# Patient Record
Sex: Female | Born: 1975 | Race: White | Hispanic: No | Marital: Married | Smoking: Current every day smoker
Health system: Southern US, Community
[De-identification: ages and names within clinical notes are randomized; demographics above are authoritative.]

## PROBLEM LIST (undated history)

## (undated) DIAGNOSIS — Z98811 Dental restoration status: Secondary | ICD-10-CM

## (undated) DIAGNOSIS — Z8719 Personal history of other diseases of the digestive system: Secondary | ICD-10-CM

## (undated) DIAGNOSIS — N62 Hypertrophy of breast: Secondary | ICD-10-CM

## (undated) DIAGNOSIS — K219 Gastro-esophageal reflux disease without esophagitis: Secondary | ICD-10-CM

## (undated) DIAGNOSIS — Z8711 Personal history of peptic ulcer disease: Secondary | ICD-10-CM

## (undated) DIAGNOSIS — I10 Essential (primary) hypertension: Secondary | ICD-10-CM

## (undated) HISTORY — PX: REDUCTION MAMMAPLASTY: SUR839

## (undated) HISTORY — PX: UPPER GASTROINTESTINAL ENDOSCOPY: SHX188

---
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
## 2011-01-28 ENCOUNTER — Emergency Department (HOSPITAL_COMMUNITY)
Admission: EM | Admit: 2011-01-28 | Discharge: 2011-01-28 | Disposition: A | Discharge status: Home / Self Care | Payer: BC Managed Care – PPO | Attending: Emergency Medicine | Admitting: Emergency Medicine

## 2011-01-28 DIAGNOSIS — K2970 Gastritis, unspecified, without bleeding: Secondary | ICD-10-CM | POA: Insufficient documentation

## 2011-01-28 DIAGNOSIS — R1013 Epigastric pain: Secondary | ICD-10-CM | POA: Insufficient documentation

## 2011-01-28 LAB — URINALYSIS, ROUTINE W REFLEX MICROSCOPIC
Hgb urine dipstick: NEGATIVE
Specific Gravity, Urine: >1.03 — ABNORMAL HIGH (ref 1.005–1.030)
Urobilinogen, UA: 0.2 mg/dL (ref 0.0–1.0)

## 2011-01-28 LAB — CBC
Hemoglobin: 13.9 g/dL (ref 12.0–15.0)
MCH: 31.1 pg (ref 26.0–34.0)
RBC: 4.47 MIL/uL (ref 3.87–5.11)
WBC: 9.7 10*3/uL (ref 4.0–10.5)

## 2011-01-28 LAB — COMPREHENSIVE METABOLIC PANEL
ALT: 15 U/L (ref 0–35)
AST: 22 U/L (ref 0–37)
Albumin: 3.5 g/dL (ref 3.5–5.2)
Alkaline Phosphatase: 70 U/L (ref 39–117)
CO2: 24 mEq/L (ref 19–32)
Chloride: 106 mEq/L (ref 96–112)
GFR calc Af Amer: >60 mL/min (ref >60)
GFR calc non Af Amer: >60 mL/min (ref >60)
Potassium: 4 mEq/L (ref 3.5–5.1)
Sodium: 137 mEq/L (ref 135–145)
Total Bilirubin: 0.4 mg/dL (ref 0.3–1.2)

## 2011-01-28 LAB — DIFFERENTIAL
Basophils Absolute: 0 10*3/uL (ref 0.0–0.1)
Basophils Relative: 0 % (ref 0–1)
Monocytes Relative: 7 % (ref 3–12)
Neutro Abs: 5.2 10*3/uL (ref 1.7–7.7)
Neutrophils Relative %: 54 % (ref 43–77)

## 2011-09-05 LAB — OB RESULTS CONSOLE ABO/RH: RH Type: NEGATIVE

## 2011-09-05 LAB — OB RESULTS CONSOLE GC/CHLAMYDIA
Chlamydia: NEGATIVE
Gonorrhea: NEGATIVE

## 2011-09-05 LAB — OB RESULTS CONSOLE RPR: RPR: NONREACTIVE

## 2011-09-05 LAB — OB RESULTS CONSOLE ANTIBODY SCREEN: Antibody Screen: NEGATIVE

## 2011-09-05 LAB — OB RESULTS CONSOLE HIV ANTIBODY (ROUTINE TESTING): HIV: NONREACTIVE

## 2011-09-29 ENCOUNTER — Ambulatory Visit (HOSPITAL_COMMUNITY)
Admission: RE | Admit: 2011-09-29 | Discharge: 2011-09-29 | Disposition: A | Discharge status: Home / Self Care | Payer: BC Managed Care – PPO | Source: Ambulatory Visit | Attending: Obstetrics and Gynecology | Admitting: Obstetrics and Gynecology

## 2011-09-29 ENCOUNTER — Encounter (HOSPITAL_COMMUNITY): Payer: Self-pay

## 2011-09-29 ENCOUNTER — Other Ambulatory Visit (HOSPITAL_COMMUNITY): Payer: Self-pay | Admitting: Obstetrics and Gynecology

## 2011-09-29 DIAGNOSIS — R291 Meningismus: Secondary | ICD-10-CM

## 2011-09-29 DIAGNOSIS — IMO0002 Reserved for concepts with insufficient information to code with codable children: Secondary | ICD-10-CM

## 2011-09-29 DIAGNOSIS — O09529 Supervision of elderly multigravida, unspecified trimester: Secondary | ICD-10-CM | POA: Insufficient documentation

## 2011-09-29 DIAGNOSIS — O351XX0 Maternal care for (suspected) chromosomal abnormality in fetus, not applicable or unspecified: Secondary | ICD-10-CM | POA: Insufficient documentation

## 2011-09-29 DIAGNOSIS — O3510X0 Maternal care for (suspected) chromosomal abnormality in fetus, unspecified, not applicable or unspecified: Secondary | ICD-10-CM | POA: Insufficient documentation

## 2011-09-29 NOTE — Progress Notes (Signed)
Obstetric ultrasound performed today.  Please see report in [facility]. 

## 2011-09-29 NOTE — Progress Notes (Signed)
Obstetric ultrasound completed today.  Please see report in [facility]. 

## 2011-09-30 ENCOUNTER — Other Ambulatory Visit: Payer: Self-pay

## 2011-09-30 DIAGNOSIS — O09519 Supervision of elderly primigravida, unspecified trimester: Secondary | ICD-10-CM | POA: Insufficient documentation

## 2011-09-30 DIAGNOSIS — O289 Unspecified abnormal findings on antenatal screening of mother: Secondary | ICD-10-CM | POA: Insufficient documentation

## 2011-09-30 NOTE — Progress Notes (Signed)
Genetic Counseling  High-Risk Gestation Note  Appointment Date:  09/29/2011 Referred By: Meriel Pica, MD Date of Birth:  Sep 14, 1976 Partner: Margarita Rana  Pregnancy History: G1P0000 Estimated Date of Delivery: 04/08/12 Estimated Gestational Age: [redacted]w[redacted]d Attending: Rica Koyanagi, MD    Carol Dixon and her husband, Mr. Aitanna Haubner, were seen for genetic counseling regarding a maternal age of 35 y.o. at delivery and an increased risk for Down syndrome based on first trimester screening performed through [facility]. The couple was also accompanied by the patient's mother.   They were counseled regarding maternal age and the association with risk for chromosome conditions due to nondisjunction with aging of the ova.   We reviewed chromosomes, nondisjunction, and the associated 1 in 114 risk for fetal aneuploidy related to a maternal age of 21 at delivery at [redacted]w[redacted]d gestation.  They were counseled that the risk for aneuploidy decreases as gestational age increases, accounting for those pregnancies which spontaneously abort.  We specifically discussed Down syndrome (trisomy 47), trisomies 88 and 34, and sex chromosome aneuploidies (47,XXX and 47,XXY) including the common features and prognoses of each.   We also reviewed Mrs. Osei's First trimester screen result and the associated increase in risk for fetal Down syndrome (1 in 279 to 1 in 75).  They were counseled regarding other explanations for a screen positive result including normal variation and differences in maternal metabolism.  In addition, we reviewed the screen adjusted reduction in risks for trisomy 18/13 (1 in 468 to 1 in 2,402).  They understand that First trimester screening provides a pregnancy specific risk for Down syndrome, but is not considered to be diagnostic.  We discussed that the nuchal translucency measurement obtained for first trimester screening was reported to be 2.29mm, which is at the 95%tile. Ultrasound performed  at the time of today's visit confirmed this finding. Complete ultrasound results reported separately.   We reviewed the various common etiologies for an increased NT including: aneuploidy, single gene conditions, and cardiac or great vessel abnormalities. Additionally, increased NT can be due to lymphatic system failure, decreased fetal movement, and fetal anemia.   We also discussed single gene conditions and that these conditions are not routinely tested for prenatally unless ultrasound findings or family history significantly increase the suspicion of a specific single gene disorder.  We also discussed that an increased NT can be a normal variant.   They were counseled regarding other available screening and diagnostic options including ultrasound, CVS, and amniocentesis.  Fetal echocardiogram is also available to the patient given the finding of increased NT. The risks, benefits, and limitations of each of these options were reviewed in detail. We discussed another type of screening test, noninvasive prenatal testing (NIPT), which utilizes cell free fetal DNA found in the maternal circulation. This test is not diagnostic for chromosome conditions, but can provide information regarding the presence or absence of extra fetal DNA for chromosomes 13, 18 and 21. Thus, it would not identify or rule out sex chromosome conditions. The reported detection rate is greater than 99% for Trisomy 21, greater than 97% for Trisomy 18, and is approximately 80% (8 out of 10) for Trisomy 13. The false positive rate is thought to be less than 1% for any of these conditions. After consideration of all the options, and a clear understanding of the newness and limitations of NIPT, they elected to proceed with cell free fetal DNA testing today but declined amniocentesis and CVS. Those results will be available in 8-10  days and will be forwarded to her OB office when we receive them. Detailed ultrasound and fetal echocardiogram are  available to the patient in the second trimester.  They understand that screening tests cannot rule out all birth defects or genetic syndromes.  The patient was advised of this limitation and states she still does not want diagnostic testing at this time.  However, they were counseled that 50-80% of fetuses with Down syndrome and up to 90% of fetuses with trisomies 13 and 18, when well visualized, have detectable anomalies or soft markers by ultrasound.    Mrs. Soward was provided with written information regarding cystic fibrosis (CF) including the carrier frequency and incidence in the Caucasian population, the availability of carrier testing and prenatal diagnosis if indicated.  In addition, we discussed that CF is routinely screened for as part of the [place] newborn screening panel.  She declined testing today.   Both family histories were reviewed and found to be noncontributory for birth defects, mental retardation, and known genetic conditions. Without further information regarding the provided family history, an accurate genetic risk cannot be calculated. Further genetic counseling is warranted if more information is obtained.  Mrs. Lund denied exposure to environmental toxins or chemical agents. She denied the use of alcohol or street drugs. She reported smoking approximately 5 cigarettes per day. The associations of smoking in pregnancy were reviewed and cessation encouraged. She denied significant viral illnesses during the course of her pregnancy. Her medical and surgical histories were noncontributory.     I counseled this couple regarding the above risks and available options.  The approximate face-to-face time with the genetic counselor was 30 minutes.    Quinn Plowman, MS,  Certified Genetic Counselor 09/30/2011

## 2011-10-07 ENCOUNTER — Telehealth (HOSPITAL_COMMUNITY): Payer: Self-pay | Admitting: MS"

## 2011-10-07 NOTE — Telephone Encounter (Signed)
Called Mrs. Carol Dixon to discuss results of [facility] testing (NIPT), cell free fetal DNA testing. These results are low risk. The chances for trisomy 45, trisomy 51, and trisomy 66 are reduced to less than 1 in 10,000 (0.01%). Reviewed limitations of this testing. Patient happy to hear results.

## 2011-11-07 ENCOUNTER — Other Ambulatory Visit: Payer: Self-pay

## 2012-02-08 ENCOUNTER — Observation Stay (HOSPITAL_COMMUNITY)
Admission: AD | Admit: 2012-02-08 | Discharge: 2012-02-10 | Disposition: A | Discharge status: Home / Self Care | Payer: BC Managed Care – PPO | Source: Ambulatory Visit | Attending: Obstetrics and Gynecology | Admitting: Obstetrics and Gynecology

## 2012-02-08 ENCOUNTER — Encounter (HOSPITAL_COMMUNITY): Payer: Self-pay | Admitting: *Deleted

## 2012-02-08 DIAGNOSIS — IMO0002 Reserved for concepts with insufficient information to code with codable children: Principal | ICD-10-CM | POA: Insufficient documentation

## 2012-02-08 DIAGNOSIS — O09519 Supervision of elderly primigravida, unspecified trimester: Secondary | ICD-10-CM

## 2012-02-08 DIAGNOSIS — O289 Unspecified abnormal findings on antenatal screening of mother: Secondary | ICD-10-CM

## 2012-02-08 DIAGNOSIS — O1490 Unspecified pre-eclampsia, unspecified trimester: Secondary | ICD-10-CM

## 2012-02-08 MED ORDER — DOCUSATE SODIUM 100 MG PO CAPS
100.0000 mg | ORAL_CAPSULE | Freq: Every day | ORAL | Status: DC
  Administered 2012-02-09 – 2012-02-10 (×2): 100 mg via ORAL
  Filled 2012-02-08 (×3): qty 1

## 2012-02-08 MED ORDER — CALCIUM CARBONATE ANTACID 500 MG PO CHEW: 2.0000 | CHEWABLE_TABLET | ORAL | Status: DC | PRN

## 2012-02-08 MED ORDER — PRENATAL MULTIVITAMIN CH
1.0000 | ORAL_TABLET | Freq: Every day | ORAL | Status: DC
Start: 2012-02-09 — End: 2012-02-10
  Administered 2012-02-09 – 2012-02-10 (×2): 1 via ORAL
  Filled 2012-02-08 (×3): qty 1

## 2012-02-08 MED ORDER — ACETAMINOPHEN 325 MG PO TABS: 650.0000 mg | ORAL_TABLET | ORAL | Status: DC | PRN

## 2012-02-08 MED ORDER — BETAMETHASONE SOD PHOS & ACET 6 (3-3) MG/ML IJ SUSP
12.0000 mg | INTRAMUSCULAR | Status: AC
  Administered 2012-02-08 – 2012-02-09 (×2): 12 mg via INTRAMUSCULAR
  Filled 2012-02-08 (×2): qty 2

## 2012-02-08 MED ORDER — ZOLPIDEM TARTRATE 10 MG PO TABS: 10.0000 mg | ORAL_TABLET | Freq: Every evening | ORAL | Status: DC | PRN

## 2012-02-08 NOTE — H&P (Signed)
  Carol Dixon  DICTATION # [id number] CSN# [id number]   Meriel Pica, MD 02/08/2012 9:17 PM

## 2012-02-09 ENCOUNTER — Encounter (HOSPITAL_COMMUNITY): Payer: Self-pay | Admitting: *Deleted

## 2012-02-09 LAB — CREATININE CLEARANCE, URINE, 24 HOUR
Creatinine Clearance: 135 mL/min — ABNORMAL HIGH (ref 75–115)
Creatinine, 24H Ur: 1144 mg/d (ref 700–1800)

## 2012-02-09 LAB — COMPREHENSIVE METABOLIC PANEL
ALT: 10 U/L (ref 0–35)
Alkaline Phosphatase: 169 U/L — ABNORMAL HIGH (ref 39–117)
CO2: 21 mEq/L (ref 19–32)
Chloride: 104 mEq/L (ref 96–112)
GFR calc Af Amer: >90 mL/min (ref >90)
GFR calc non Af Amer: >90 mL/min (ref >90)
Glucose, Bld: 157 mg/dL — ABNORMAL HIGH (ref 70–99)
Potassium: 3.8 mEq/L (ref 3.5–5.1)
Sodium: 138 mEq/L (ref 135–145)
Total Bilirubin: 0.1 mg/dL — ABNORMAL LOW (ref 0.3–1.2)
Total Protein: 5.2 g/dL — ABNORMAL LOW (ref 6.0–8.3)

## 2012-02-09 LAB — CBC
HCT: 31 % — ABNORMAL LOW (ref 36.0–46.0)
Hemoglobin: 10.5 g/dL — ABNORMAL LOW (ref 12.0–15.0)
RBC: 3.39 MIL/uL — ABNORMAL LOW (ref 3.87–5.11)
WBC: 15.1 10*3/uL — ABNORMAL HIGH (ref 4.0–10.5)

## 2012-02-09 MED ORDER — FERROUS SULFATE 325 (65 FE) MG PO TABS
325.0000 mg | ORAL_TABLET | Freq: Every day | ORAL | Status: DC
  Administered 2012-02-09 – 2012-02-10 (×2): 325 mg via ORAL
  Filled 2012-02-09 (×2): qty 1

## 2012-02-09 NOTE — Progress Notes (Signed)
31 4/7 weeks  Mild HA for 2 weeks, none today, no vision change/epigastric pain  Blood pressure 130/72, pulse 85, temperature 98.2 F (36.8 C), temperature source Oral, resp. rate 16, height 4\' 10"  (1.473 m), weight 83.326 kg (183 lb 11.2 oz), last menstrual period 06/28/2011.  Lungs CTA Abd No epigastric pain DTR  2+ without clonus  NST reactive  A: R/O preeclampsia-BPs in office rising over last 2 weeks. On Monday had elevated BP with some proteinuria.  Wed BP 160s/90s with 3+ ptn on dipstick.  P:  Preeclampsia       Continue 24 hour urine       Finish BMTZ course       D/W patient BR as pregnancy progresses

## 2012-02-09 NOTE — Progress Notes (Signed)
UR Chart review completed.  

## 2012-02-09 NOTE — Progress Notes (Signed)
Lactation Consultation Note  Patient Name: Carol Dixon Date: 02/09/2012   Patient is [redacted] weeks gestation and currently on antenatal unit for 24-hour urine test.  She has been watching in-house baby care and breastfeeding videos and has questions about choosing a breast pump.  LC provided the [facility] Resource packet with contact information and pump information but recommends mom wait until baby is born to decide on pump choice, based on special needs if baby born premature and also insurance coverage for purchased or rental pump.  LC encouraged mom to contact [facility] Resources as needed, both during and/or after hospital stay.  Maternal Data    Feeding    LATCH Score/Interventions      N/A - mom is antepartum                Lactation Tools Discussed/Used    Pump options and resources Consult Status    Mom to contact [facility] for further questions, as needed  Lynda Rainwater 02/09/2012, 7:09 PM

## 2012-02-09 NOTE — Progress Notes (Signed)
First assist with stedy.

## 2012-02-09 NOTE — Progress Notes (Signed)
Third assist to bathroom via stedy.

## 2012-02-09 NOTE — Progress Notes (Signed)
Second assist to bathroom via [employer]

## 2012-02-09 NOTE — H&P (Signed)
Carol Dixon, Carol Dixon                 ACCOUNT NO.:  [id number]  MEDICAL RECORD NO.:  [id number]  LOCATION:                                 FACILITY:  PHYSICIAN:  Duke Salvia. Marcelle Overlie, M.D.DATE OF BIRTH:  03/23/1976  DATE OF ADMISSION:  02/08/2012 DATE OF DISCHARGE:                             HISTORY & PHYSICAL   CHIEF COMPLAINT:  Preeclampsia.  HISTORY OF PRESENT ILLNESS:  A 36 year old, G1, P0, EDD April 08, 2012, EGA 31-3/7 weeks.  This patient was seen 2 days ago with 3+ protein, BP 140/90.  PIH labs were normal.  Ultrasound showed normal findings with BPP 8/8.  Today, she was here for followup, still 3+ protein, BP 160/90, complaining of some headache and seeing some occasional stars that clear with blinking.  Admitted now for 24-hour urine, betamethasone series, and further observation.  PAST MEDICAL HISTORY:  Please see their [place] form for details.  She is Rh negative.  PHYSICAL EXAMINATION:  VITAL SIGNS:  Temp 98.2, blood pressure 160/90. HEENT:  Unremarkable. NECK:  Supple without masses. LUNGS:  Clear. CARDIOVASCULAR:  Regular rate and rhythm without murmurs, rubs, or gallops. BREASTS:  Not examined. PELVIC:  32 cm fundal height.  Cervix was long, closed. EXTREMITIES:  2+ lower extremity edema.  Reflexes 1 to 2+.  No clonus.  IMPRESSION: 1. 37-1/7th weeks intrauterine pregnancy. 2. Preeclampsia.  PLAN:  We will admit for betamethasone, 24-hour urine, and further observation.     Katelyn Kohlmeyer M. Marcelle Overlie, M.D.     RM[facility]/MEDQ  D:  02/08/2012  T:  02/08/2012  Job:  [id number]

## 2012-02-10 LAB — PROTEIN, URINE, 24 HOUR: Protein, Urine: 299 mg/dL

## 2012-02-10 LAB — COMPREHENSIVE METABOLIC PANEL
ALT: 10 U/L (ref 0–35)
Alkaline Phosphatase: 158 U/L — ABNORMAL HIGH (ref 39–117)
BUN: 16 mg/dL (ref 6–23)
CO2: 20 mEq/L (ref 19–32)
Calcium: 8.8 mg/dL (ref 8.4–10.5)
GFR calc Af Amer: >90 mL/min (ref >90)
GFR calc non Af Amer: >90 mL/min (ref >90)
Glucose, Bld: 104 mg/dL — ABNORMAL HIGH (ref 70–99)
Sodium: 138 mEq/L (ref 135–145)

## 2012-02-10 LAB — CBC
HCT: 32.3 % — ABNORMAL LOW (ref 36.0–46.0)
Hemoglobin: 10.7 g/dL — ABNORMAL LOW (ref 12.0–15.0)
MCH: 30.7 pg (ref 26.0–34.0)
RBC: 3.49 MIL/uL — ABNORMAL LOW (ref 3.87–5.11)

## 2012-02-10 NOTE — Progress Notes (Signed)
This am, patient feels fine.Denies headache, blurry vision. Afebrile  Vital signs stable Fetal heart rate is reactive Toco no UCs Ext no edema  IMPRESSION: Gestational hypertension  PLAN: Check 24 hour urine protein Check CBC, CMET Possible discharge later today after labs reviewed.

## 2012-02-10 NOTE — Discharge Summary (Signed)
Admission Diagnosis: IUP at 31 w 5 days Preeclampsia  Discharge Diagnosis: Same  Hospital Course: 36 year old G 1 P 0 at 66 w and 5 days admitted with hypertension and proteinuria. She received steroid series and collected a 24 hour urine for protein. Protein was almost 1.5 gram. Other labs were stable and her BP responded beautifully to bedrest. She was discharged home in good condition. She was advised on complete bedrest. She can work on her laptop but not to go to work. She was given strict preeclampsia precautions. She will followup in the office on Tuesday or Wednesday for NST and OV.

## 2012-02-17 ENCOUNTER — Encounter (HOSPITAL_COMMUNITY): Payer: Self-pay | Admitting: *Deleted

## 2012-02-17 ENCOUNTER — Inpatient Hospital Stay (HOSPITAL_COMMUNITY)
Admission: AD | Admit: 2012-02-17 | Discharge: 2012-02-25 | DRG: 650 | Disposition: A | Discharge status: Home / Self Care | Payer: BC Managed Care – PPO | Source: Ambulatory Visit | Attending: Obstetrics and Gynecology | Admitting: Obstetrics and Gynecology

## 2012-02-17 DIAGNOSIS — O1414 Severe pre-eclampsia complicating childbirth: Principal | ICD-10-CM | POA: Diagnosis present

## 2012-02-17 DIAGNOSIS — O864 Pyrexia of unknown origin following delivery: Secondary | ICD-10-CM | POA: Diagnosis not present

## 2012-02-17 DIAGNOSIS — O09519 Supervision of elderly primigravida, unspecified trimester: Secondary | ICD-10-CM | POA: Diagnosis present

## 2012-02-17 DIAGNOSIS — O99893 Other specified diseases and conditions complicating puerperium: Secondary | ICD-10-CM | POA: Diagnosis not present

## 2012-02-17 DIAGNOSIS — O1415 Severe pre-eclampsia, complicating the puerperium: Secondary | ICD-10-CM

## 2012-02-17 DIAGNOSIS — IMO0002 Reserved for concepts with insufficient information to code with codable children: Secondary | ICD-10-CM | POA: Diagnosis not present

## 2012-02-17 DIAGNOSIS — O289 Unspecified abnormal findings on antenatal screening of mother: Secondary | ICD-10-CM

## 2012-02-17 LAB — COMPREHENSIVE METABOLIC PANEL
AST: 17 U/L (ref 0–37)
BUN: 10 mg/dL (ref 6–23)
CO2: 25 mEq/L (ref 19–32)
Chloride: 105 mEq/L (ref 96–112)
Creatinine, Ser: 0.71 mg/dL (ref 0.50–1.10)
GFR calc Af Amer: >90 mL/min (ref >90)
GFR calc non Af Amer: >90 mL/min (ref >90)
Glucose, Bld: 91 mg/dL (ref 70–99)
Total Bilirubin: 0.1 mg/dL — ABNORMAL LOW (ref 0.3–1.2)

## 2012-02-17 LAB — CBC
HCT: 35 % — ABNORMAL LOW (ref 36.0–46.0)
Hemoglobin: 11.8 g/dL — ABNORMAL LOW (ref 12.0–15.0)
MCH: 31.1 pg (ref 26.0–34.0)
MCV: 92.3 fL (ref 78.0–100.0)
Platelets: 166 10*3/uL (ref 150–400)
RBC: 3.79 MIL/uL — ABNORMAL LOW (ref 3.87–5.11)
WBC: 14.9 10*3/uL — ABNORMAL HIGH (ref 4.0–10.5)

## 2012-02-17 MED ORDER — ZOLPIDEM TARTRATE 10 MG PO TABS
10.0000 mg | ORAL_TABLET | Freq: Every evening | ORAL | Status: DC | PRN
  Administered 2012-02-17 – 2012-02-18 (×2): 10 mg via ORAL
  Filled 2012-02-17 (×2): qty 1

## 2012-02-17 MED ORDER — CALCIUM CARBONATE ANTACID 500 MG PO CHEW
2.0000 | CHEWABLE_TABLET | ORAL | Status: DC | PRN
  Filled 2012-02-17: qty 2

## 2012-02-17 MED ORDER — DOCUSATE SODIUM 100 MG PO CAPS
100.0000 mg | ORAL_CAPSULE | Freq: Every day | ORAL | Status: DC
  Administered 2012-02-17 – 2012-02-20 (×4): 100 mg via ORAL
  Filled 2012-02-17 (×6): qty 1

## 2012-02-17 MED ORDER — ACETAMINOPHEN 325 MG PO TABS
650.0000 mg | ORAL_TABLET | ORAL | Status: DC | PRN
  Administered 2012-02-18 – 2012-02-19 (×2): 650 mg via ORAL
  Filled 2012-02-17 (×2): qty 2

## 2012-02-17 MED ORDER — PRENATAL MULTIVITAMIN CH
1.0000 | ORAL_TABLET | Freq: Every day | ORAL | Status: DC
  Administered 2012-02-17 – 2012-02-20 (×4): 1 via ORAL
  Filled 2012-02-17 (×6): qty 1

## 2012-02-17 NOTE — MAU Note (Signed)
Pt states here from MD office for [facility] eval, was here Tuesday for same thing. Denies h/a, dizziness, blurred vision, +FM.

## 2012-02-17 NOTE — Progress Notes (Signed)
Received report from R. Merrily Brittle, RN and assumed pt care

## 2012-02-17 NOTE — H&P (Signed)
Carol Dixon is a 36 y.o. female presenting at 32.5 for PIH evaluation.  Patient with increasing BP's since 29 weeks. Prior hospitalization on 4/24.  Seen in office on 4/30 pih labs ok.  24 hour urine with 5418 mg of protien in 24 hrs.  Readmitted for eval.  No PIH sx's Maternal Medical History:  Prenatal Complications - Diabetes: none.    OB History    Grav Para Term Preterm Abortions TAB SAB Ect Mult Living   1 0 0 0 0 0 0 0 0 0      Past Medical History  Diagnosis Date  . Pregnancy induced hypertension   . Ulcer    Past Surgical History  Procedure Date  . Tonsillectomy   . Upper gastrointestinal endoscopy    Family History: family history includes COPD in her maternal grandmother; Cancer in her paternal grandfather, paternal grandmother, and paternal uncle; Diabetes in her paternal aunt; Hyperlipidemia in her father and paternal aunt; Hypertension in her father and mother; and Stroke in her maternal aunt and maternal uncle. Social History:  reports that she has been smoking Cigarettes.  She has been smoking about .5 packs per day. She has never used smokeless tobacco. She reports that she does not drink alcohol or use illicit drugs.  ROS    Blood pressure 149/83, pulse 93, temperature 97.2 F (36.2 C), temperature source Oral, resp. rate 16, height 4\' 10"  (1.473 m), weight 83.972 kg (185 lb 2 oz), last menstrual period 06/28/2011, SpO2 100.00%. Maternal Exam:  Abdomen: Patient reports no abdominal tenderness. Fundal height is c/e dates.       Fetal Exam Fetal Monitor Review: Variability: moderate (6-25 bpm).   Pattern: accelerations present and no decelerations.    Fetal State Assessment: Category I - tracings are normal.     Physical Exam  Constitutional: She appears well-developed and well-nourished.  Cardiovascular: Normal rate, regular rhythm and normal heart sounds.   Respiratory: Effort normal and breath sounds normal.  GI: Soft. Bowel sounds are normal.    Neurological: She has normal reflexes.    Prenatal labs: ABO, Rh: O/Negative/-- (11/19 0000) Antibody: Negative (11/19 0000) Rubella: Immune (11/19 0000) RPR: Nonreactive (11/19 0000)  HBsAg: Negative (11/19 0000)  HIV: Non-reactive (11/19 0000)  GBS: Negative (11/19 0000)   Assessment/Plan: IUP at 32.5 with pre-eclampsia and more proteinuria.  admitt br, check labs monitor.  Already received betamethasone.   Guliana Weyandt S 02/17/2012, 3:58 PM

## 2012-02-18 ENCOUNTER — Inpatient Hospital Stay (HOSPITAL_COMMUNITY): Payer: BC Managed Care – PPO

## 2012-02-18 NOTE — Progress Notes (Signed)
Patient ID: Carol Dixon, female   DOB: [birth year], 36 y.o.   MRN: [id number] S: NO PIH SX'S O: 153/91 OTHER VSS       GRAVID UTERUS NONTENDER        GOOD FETAL MOVEMENT       DTR'S 2 +       NST'S REACTIVE       PIH LABS WNL A: IUP AT 32.6 WITH PRE ECLAMPSIA P: REPEAT 24 HR URINE      SONO TODAY      LABS IN AM

## 2012-02-19 LAB — COMPREHENSIVE METABOLIC PANEL
ALT: 12 U/L (ref 0–35)
Albumin: 2 g/dL — ABNORMAL LOW (ref 3.5–5.2)
Alkaline Phosphatase: 188 U/L — ABNORMAL HIGH (ref 39–117)
Glucose, Bld: 73 mg/dL (ref 70–99)
Potassium: 3.9 mEq/L (ref 3.5–5.1)
Sodium: 138 mEq/L (ref 135–145)
Total Protein: 4.9 g/dL — ABNORMAL LOW (ref 6.0–8.3)

## 2012-02-19 LAB — CBC
Hemoglobin: 11.7 g/dL — ABNORMAL LOW (ref 12.0–15.0)
MCHC: 33.1 g/dL (ref 30.0–36.0)
RDW: 15.3 % (ref 11.5–15.5)
WBC: 12 10*3/uL — ABNORMAL HIGH (ref 4.0–10.5)

## 2012-02-19 LAB — CREATININE CLEARANCE, URINE, 24 HOUR
Collection Interval-CRCL: 24 hours
Creatinine Clearance: 162 mL/min — ABNORMAL HIGH (ref 75–115)

## 2012-02-19 LAB — PROTEIN, URINE, 24 HOUR
Protein, 24H Urine: 6300 mg/d — ABNORMAL HIGH (ref 50–100)
Protein, Urine: 315 mg/dL

## 2012-02-19 NOTE — Progress Notes (Signed)
Patient ID: Carol Dixon, female   DOB: Jun 28, 1976, 36 y.o.   MRN: [id number] S: NO PIH SX'S O: 163/80   MAX171/90  OTHER VSS      GRAVID UTERUS NONTNENDER      DTR" 1+       PIH LABS STILL NORMAL NO ELEVATION OF CREATININE      BPP 8/8 NORMAL AFI A:  IUP AT 33 WEEKS WITH PRE-ECLAMPSIA SEVERE BASED ON PROTIENURIA P:  RECHECK 24 HR URINE.  WOULD NOT DELIVER BASED JUST ON PROTIENURIA

## 2012-02-20 ENCOUNTER — Encounter (HOSPITAL_COMMUNITY)
Admission: AD | Disposition: A | Discharge status: Home / Self Care | Payer: Self-pay | Source: Ambulatory Visit | Attending: Obstetrics and Gynecology

## 2012-02-20 ENCOUNTER — Encounter (HOSPITAL_COMMUNITY): Payer: Self-pay | Admitting: Anesthesiology

## 2012-02-20 ENCOUNTER — Encounter (HOSPITAL_COMMUNITY): Payer: Self-pay | Admitting: Neonatology

## 2012-02-20 ENCOUNTER — Inpatient Hospital Stay (HOSPITAL_COMMUNITY): Payer: BC Managed Care – PPO

## 2012-02-20 ENCOUNTER — Inpatient Hospital Stay (HOSPITAL_COMMUNITY): Payer: BC Managed Care – PPO | Admitting: Anesthesiology

## 2012-02-20 LAB — COMPREHENSIVE METABOLIC PANEL
AST: 14 U/L (ref 0–37)
Albumin: 2.2 g/dL — ABNORMAL LOW (ref 3.5–5.2)
BUN: 10 mg/dL (ref 6–23)
CO2: 20 mEq/L (ref 19–32)
Calcium: 8.9 mg/dL (ref 8.4–10.5)
Chloride: 104 mEq/L (ref 96–112)
Creatinine, Ser: 0.59 mg/dL (ref 0.50–1.10)
Creatinine, Ser: 0.52 mg/dL (ref 0.50–1.10)
GFR calc non Af Amer: >90 mL/min (ref >90)
Total Bilirubin: 0.1 mg/dL — ABNORMAL LOW (ref 0.3–1.2)
Total Protein: 5.8 g/dL — ABNORMAL LOW (ref 6.0–8.3)

## 2012-02-20 LAB — MRSA PCR SCREENING: MRSA by PCR: INVALID — AB

## 2012-02-20 LAB — CBC
HCT: 37.4 % (ref 36.0–46.0)
HCT: 35.1 % — ABNORMAL LOW (ref 36.0–46.0)
MCHC: 32.9 g/dL (ref 30.0–36.0)
MCV: 92.6 fL (ref 78.0–100.0)
MCV: 91.9 fL (ref 78.0–100.0)
Platelets: 176 10*3/uL (ref 150–400)
RBC: 3.82 MIL/uL — ABNORMAL LOW (ref 3.87–5.11)
RDW: 15.3 % (ref 11.5–15.5)
WBC: 12.9 10*3/uL — ABNORMAL HIGH (ref 4.0–10.5)

## 2012-02-20 SURGERY — Surgical Case
Anesthesia: Spinal | Site: Abdomen | Wound class: Clean Contaminated

## 2012-02-20 MED ORDER — BISACODYL 10 MG RE SUPP: 10.0000 mg | Freq: Every day | RECTAL | Status: DC | PRN

## 2012-02-20 MED ORDER — SODIUM CHLORIDE 0.9 % IJ SOLN
3.0000 mL | Freq: Two times a day (BID) | INTRAMUSCULAR | Status: DC
  Administered 2012-02-20: 3 mL via INTRAVENOUS

## 2012-02-20 MED ORDER — WITCH HAZEL-GLYCERIN EX PADS: 1.0000 "application " | MEDICATED_PAD | CUTANEOUS | Status: DC | PRN

## 2012-02-20 MED ORDER — FENTANYL CITRATE 0.05 MG/ML IJ SOLN
INTRAMUSCULAR | Status: DC | PRN
  Administered 2012-02-20 (×2): 50 ug via INTRAVENOUS

## 2012-02-20 MED ORDER — SIMETHICONE 80 MG PO CHEW: 80.0000 mg | CHEWABLE_TABLET | ORAL | Status: DC | PRN

## 2012-02-20 MED ORDER — DIPHENHYDRAMINE HCL 25 MG PO CAPS: 25.0000 mg | ORAL_CAPSULE | Freq: Four times a day (QID) | ORAL | Status: DC | PRN

## 2012-02-20 MED ORDER — DIPHENHYDRAMINE HCL 50 MG/ML IJ SOLN: 12.5000 mg | INTRAMUSCULAR | Status: DC | PRN

## 2012-02-20 MED ORDER — KETOROLAC TROMETHAMINE 30 MG/ML IJ SOLN: 30.0000 mg | Freq: Four times a day (QID) | INTRAMUSCULAR | Status: AC | PRN

## 2012-02-20 MED ORDER — OXYTOCIN 20 UNITS IN LACTATED RINGERS INFUSION - SIMPLE: 125.0000 mL/h | INTRAVENOUS | Status: AC

## 2012-02-20 MED ORDER — ONDANSETRON HCL 4 MG/2ML IJ SOLN
INTRAMUSCULAR | Status: AC
  Filled 2012-02-20: qty 2

## 2012-02-20 MED ORDER — ONDANSETRON HCL 4 MG/2ML IJ SOLN: 4.0000 mg | INTRAMUSCULAR | Status: DC | PRN

## 2012-02-20 MED ORDER — LANOLIN HYDROUS EX OINT: 1.0000 "application " | TOPICAL_OINTMENT | CUTANEOUS | Status: DC | PRN

## 2012-02-20 MED ORDER — ONDANSETRON HCL 4 MG/2ML IJ SOLN
INTRAMUSCULAR | Status: DC | PRN
  Administered 2012-02-20: 4 mg via INTRAVENOUS

## 2012-02-20 MED ORDER — PHENYLEPHRINE HCL 10 MG/ML IJ SOLN
INTRAMUSCULAR | Status: DC | PRN
  Administered 2012-02-20: 40 ug via INTRAVENOUS
  Administered 2012-02-20: 80 ug via INTRAVENOUS
  Administered 2012-02-20 (×4): 40 ug via INTRAVENOUS

## 2012-02-20 MED ORDER — METOCLOPRAMIDE HCL 10 MG PO TABS
20.0000 mg | ORAL_TABLET | Freq: Once | ORAL | Status: AC
  Administered 2012-02-20: 20 mg via ORAL
  Filled 2012-02-20: qty 2

## 2012-02-20 MED ORDER — SODIUM CHLORIDE 0.9 % IJ SOLN
3.0000 mL | INTRAMUSCULAR | Status: DC | PRN
  Administered 2012-02-23 – 2012-02-24 (×3): 3 mL via INTRAVENOUS

## 2012-02-20 MED ORDER — EPHEDRINE SULFATE 50 MG/ML IJ SOLN
INTRAMUSCULAR | Status: DC | PRN
  Administered 2012-02-20 (×3): 10 mg via INTRAVENOUS

## 2012-02-20 MED ORDER — CEFOTETAN DISODIUM 1 G IJ SOLR
1.0000 g | INTRAMUSCULAR | Status: AC
  Administered 2012-02-20: 1 g via INTRAVENOUS
  Filled 2012-02-20: qty 1

## 2012-02-20 MED ORDER — MORPHINE SULFATE 0.5 MG/ML IJ SOLN
INTRAMUSCULAR | Status: AC
  Filled 2012-02-20: qty 10

## 2012-02-20 MED ORDER — SENNOSIDES-DOCUSATE SODIUM 8.6-50 MG PO TABS
2.0000 | ORAL_TABLET | Freq: Every day | ORAL | Status: DC
  Administered 2012-02-20 – 2012-02-24 (×5): 2 via ORAL

## 2012-02-20 MED ORDER — NALBUPHINE HCL 10 MG/ML IJ SOLN
5.0000 mg | INTRAMUSCULAR | Status: DC | PRN
  Administered 2012-02-20: 5 mg via INTRAVENOUS
  Filled 2012-02-20: qty 1

## 2012-02-20 MED ORDER — NALBUPHINE HCL 10 MG/ML IJ SOLN: 5.0000 mg | INTRAMUSCULAR | Status: DC | PRN

## 2012-02-20 MED ORDER — ZOLPIDEM TARTRATE 5 MG PO TABS
5.0000 mg | ORAL_TABLET | Freq: Every evening | ORAL | Status: DC | PRN
  Administered 2012-02-21 – 2012-02-22 (×3): 5 mg via ORAL
  Filled 2012-02-20: qty 1
  Filled 2012-02-20: qty 2

## 2012-02-20 MED ORDER — OXYTOCIN 10 UNIT/ML IJ SOLN
INTRAMUSCULAR | Status: AC
  Filled 2012-02-20: qty 2

## 2012-02-20 MED ORDER — PRENATAL MULTIVITAMIN CH
1.0000 | ORAL_TABLET | Freq: Every day | ORAL | Status: DC
  Administered 2012-02-21 – 2012-02-25 (×5): 1 via ORAL
  Filled 2012-02-20 (×5): qty 1

## 2012-02-20 MED ORDER — TETANUS-DIPHTH-ACELL PERTUSSIS 5-2.5-18.5 LF-MCG/0.5 IM SUSP
0.5000 mL | Freq: Once | INTRAMUSCULAR | Status: AC
  Administered 2012-02-21: 0.5 mL via INTRAMUSCULAR
  Filled 2012-02-20: qty 0.5

## 2012-02-20 MED ORDER — HYDROMORPHONE HCL PF 1 MG/ML IJ SOLN
0.2500 mg | INTRAMUSCULAR | Status: DC | PRN
  Administered 2012-02-20: 0.5 mg via INTRAVENOUS

## 2012-02-20 MED ORDER — DIPHENHYDRAMINE HCL 25 MG PO CAPS
25.0000 mg | ORAL_CAPSULE | ORAL | Status: DC | PRN
  Administered 2012-02-21: 25 mg via ORAL
  Filled 2012-02-20 (×2): qty 1

## 2012-02-20 MED ORDER — SCOPOLAMINE 1 MG/3DAYS TD PT72: 1.0000 | MEDICATED_PATCH | Freq: Once | TRANSDERMAL | Status: DC

## 2012-02-20 MED ORDER — LACTATED RINGERS IV SOLN
INTRAVENOUS | Status: DC
  Administered 2012-02-20: 14:00:00 via INTRAVENOUS

## 2012-02-20 MED ORDER — OXYTOCIN 10 UNIT/ML IJ SOLN
INTRAMUSCULAR | Status: DC | PRN
  Administered 2012-02-20: 20 [IU] via INTRAMUSCULAR

## 2012-02-20 MED ORDER — NALOXONE HCL 0.4 MG/ML IJ SOLN: 1.0000 ug/kg/h | INTRAMUSCULAR | Status: DC | PRN

## 2012-02-20 MED ORDER — NALOXONE HCL 0.4 MG/ML IJ SOLN: 0.4000 mg | INTRAMUSCULAR | Status: DC | PRN

## 2012-02-20 MED ORDER — METOCLOPRAMIDE HCL 5 MG/ML IJ SOLN: 10.0000 mg | Freq: Three times a day (TID) | INTRAMUSCULAR | Status: DC | PRN

## 2012-02-20 MED ORDER — FAMOTIDINE 20 MG PO TABS
20.0000 mg | ORAL_TABLET | Freq: Once | ORAL | Status: AC
  Administered 2012-02-20: 20 mg via ORAL
  Filled 2012-02-20 (×2): qty 1

## 2012-02-20 MED ORDER — MEASLES, MUMPS & RUBELLA VAC ~~LOC~~ INJ
0.5000 mL | INJECTION | Freq: Once | SUBCUTANEOUS | Status: DC
  Filled 2012-02-20: qty 0.5

## 2012-02-20 MED ORDER — LACTATED RINGERS IV SOLN
INTRAVENOUS | Status: DC
  Administered 2012-02-21: 01:00:00 via INTRAVENOUS

## 2012-02-20 MED ORDER — LACTATED RINGERS IV SOLN
INTRAVENOUS | Status: DC | PRN
  Administered 2012-02-20: 16:00:00 via INTRAVENOUS

## 2012-02-20 MED ORDER — FENTANYL CITRATE 0.05 MG/ML IJ SOLN
INTRAMUSCULAR | Status: AC
  Filled 2012-02-20: qty 2

## 2012-02-20 MED ORDER — MAGNESIUM SULFATE 40 G IN LACTATED RINGERS - SIMPLE
2.0000 g/h | INTRAVENOUS | Status: DC
  Administered 2012-02-20: 2 g/h via INTRAVENOUS
  Filled 2012-02-20: qty 500

## 2012-02-20 MED ORDER — ONDANSETRON HCL 4 MG PO TABS: 4.0000 mg | ORAL_TABLET | ORAL | Status: DC | PRN

## 2012-02-20 MED ORDER — HYDROMORPHONE HCL PF 1 MG/ML IJ SOLN
INTRAMUSCULAR | Status: AC
  Filled 2012-02-20: qty 1

## 2012-02-20 MED ORDER — MAGNESIUM SULFATE BOLUS VIA INFUSION
4.0000 g | Freq: Once | INTRAVENOUS | Status: AC
  Administered 2012-02-20: 4 g via INTRAVENOUS
  Filled 2012-02-20: qty 500

## 2012-02-20 MED ORDER — MENTHOL 3 MG MT LOZG: 1.0000 | LOZENGE | OROMUCOSAL | Status: DC | PRN

## 2012-02-20 MED ORDER — MEDROXYPROGESTERONE ACETATE 150 MG/ML IM SUSP: 150.0000 mg | INTRAMUSCULAR | Status: DC | PRN

## 2012-02-20 MED ORDER — IBUPROFEN 600 MG PO TABS
600.0000 mg | ORAL_TABLET | Freq: Four times a day (QID) | ORAL | Status: DC
  Administered 2012-02-20 – 2012-02-25 (×19): 600 mg via ORAL
  Filled 2012-02-20 (×10): qty 1

## 2012-02-20 MED ORDER — OXYTOCIN 20 UNITS IN LACTATED RINGERS INFUSION - SIMPLE
INTRAVENOUS | Status: AC
  Administered 2012-02-20: 20 [IU]
  Filled 2012-02-20: qty 1000

## 2012-02-20 MED ORDER — MEPERIDINE HCL 25 MG/ML IJ SOLN: 6.2500 mg | INTRAMUSCULAR | Status: DC | PRN

## 2012-02-20 MED ORDER — ONDANSETRON HCL 4 MG/2ML IJ SOLN: 4.0000 mg | Freq: Three times a day (TID) | INTRAMUSCULAR | Status: DC | PRN

## 2012-02-20 MED ORDER — DIPHENHYDRAMINE HCL 50 MG/ML IJ SOLN: 25.0000 mg | INTRAMUSCULAR | Status: DC | PRN

## 2012-02-20 MED ORDER — DIBUCAINE 1 % RE OINT: 1.0000 "application " | TOPICAL_OINTMENT | RECTAL | Status: DC | PRN

## 2012-02-20 MED ORDER — IBUPROFEN 600 MG PO TABS
600.0000 mg | ORAL_TABLET | Freq: Four times a day (QID) | ORAL | Status: DC | PRN
  Filled 2012-02-20 (×8): qty 1

## 2012-02-20 MED ORDER — FLEET ENEMA 7-19 GM/118ML RE ENEM: 1.0000 | ENEMA | Freq: Every day | RECTAL | Status: DC | PRN

## 2012-02-20 MED ORDER — OXYCODONE-ACETAMINOPHEN 5-325 MG PO TABS
1.0000 | ORAL_TABLET | ORAL | Status: DC | PRN
  Administered 2012-02-21: 1 via ORAL
  Administered 2012-02-21: 2 via ORAL
  Administered 2012-02-21: 1 via ORAL
  Administered 2012-02-21: 2 via ORAL
  Administered 2012-02-21: 1 via ORAL
  Administered 2012-02-22 – 2012-02-24 (×11): 2 via ORAL
  Administered 2012-02-24: 1 via ORAL
  Administered 2012-02-24: 2 via ORAL
  Administered 2012-02-24: 1 via ORAL
  Administered 2012-02-24 – 2012-02-25 (×4): 2 via ORAL
  Filled 2012-02-20 (×3): qty 2
  Filled 2012-02-20 (×2): qty 1
  Filled 2012-02-20 (×6): qty 2
  Filled 2012-02-20: qty 1
  Filled 2012-02-20: qty 2
  Filled 2012-02-20: qty 1
  Filled 2012-02-20 (×5): qty 2
  Filled 2012-02-20: qty 1
  Filled 2012-02-20 (×3): qty 2

## 2012-02-20 MED ORDER — KETOROLAC TROMETHAMINE 60 MG/2ML IM SOLN: 60.0000 mg | Freq: Once | INTRAMUSCULAR | Status: AC | PRN

## 2012-02-20 MED ORDER — SIMETHICONE 80 MG PO CHEW
80.0000 mg | CHEWABLE_TABLET | Freq: Three times a day (TID) | ORAL | Status: DC
  Administered 2012-02-20 – 2012-02-25 (×17): 80 mg via ORAL

## 2012-02-20 SURGICAL SUPPLY — 29 items
BARRIER ADHS 3X4 INTERCEED (GAUZE/BANDAGES/DRESSINGS) IMPLANT
BRR ADH 4X3 ABS CNTRL BYND (GAUZE/BANDAGES/DRESSINGS)
CHLORAPREP W/TINT 26ML (MISCELLANEOUS) ×2 IMPLANT
CLOTH BEACON ORANGE TIMEOUT ST (SAFETY) ×2 IMPLANT
CONTAINER PREFILL 10% NBF 15ML (MISCELLANEOUS) IMPLANT
DRSG COVADERM 4X10 (GAUZE/BANDAGES/DRESSINGS) ×1 IMPLANT
ELECT REM PT RETURN 9FT ADLT (ELECTROSURGICAL) ×2
ELECTRODE REM PT RTRN 9FT ADLT (ELECTROSURGICAL) ×1 IMPLANT
EXTRACTOR VACUUM M CUP 4 TUBE (SUCTIONS) IMPLANT
GLOVE BIO SURGEON STRL SZ 6.5 (GLOVE) ×4 IMPLANT
GOWN PREVENTION PLUS LG XLONG (DISPOSABLE) ×6 IMPLANT
KIT ABG SYR 3ML LUER SLIP (SYRINGE) IMPLANT
NDL HYPO 25X5/8 SAFETYGLIDE (NEEDLE) ×1 IMPLANT
NEEDLE HYPO 22GX1.5 SAFETY (NEEDLE) ×2 IMPLANT
NEEDLE HYPO 25X5/8 SAFETYGLIDE (NEEDLE) ×2 IMPLANT
NS IRRIG 1000ML POUR BTL (IV SOLUTION) ×2 IMPLANT
PACK C SECTION WH (CUSTOM PROCEDURE TRAY) ×2 IMPLANT
SLEEVE SCD COMPRESS KNEE MED (MISCELLANEOUS) IMPLANT
STAPLER VISISTAT 35W (STAPLE) IMPLANT
SUT CHROMIC 0 CTX 36 (SUTURE) ×4 IMPLANT
SUT PLAIN 0 NONE (SUTURE) IMPLANT
SUT PLAIN 2 0 XLH (SUTURE) IMPLANT
SUT VIC AB 0 CT1 27 (SUTURE) ×6
SUT VIC AB 0 CT1 27XBRD ANBCTR (SUTURE) ×3 IMPLANT
SUT VIC AB 4-0 KS 27 (SUTURE) IMPLANT
SYR CONTROL 10ML LL (SYRINGE) ×2 IMPLANT
TOWEL OR 17X24 6PK STRL BLUE (TOWEL DISPOSABLE) ×4 IMPLANT
TRAY FOLEY CATH 14FR (SET/KITS/TRAYS/PACK) ×2 IMPLANT
WATER STERILE IRR 1000ML POUR (IV SOLUTION) ×1 IMPLANT

## 2012-02-20 NOTE — Progress Notes (Signed)
UR chart review completed.  

## 2012-02-20 NOTE — Brief Op Note (Signed)
02/17/2012 - 02/20/2012  4:37 PM  PATIENT:  Carol Dixon  36 y.o. female  PRE-OPERATIVE DIAGNOSIS:  secevere preclampsia, IUP at 44 1/7  POST-OPERATIVE DIAGNOSIS:  secevere preclampsia, IUP at 33 1/7  PROCEDURE:  Procedure(s) (LRB): Primary Low Transverse CESAREAN SECTION (N/A)  SURGEON:  Surgeon(s) and Role:    * Jeani Hawking, MD - Primary  PHYSICIAN ASSISTANT:   ASSISTANTS: none   ANESTHESIA:   spinal  EBL:  Total I/O In: 400 [I.V.:400] Out: 500 [Urine:100; Blood:400]  BLOOD ADMINISTERED:none  DRAINS: Urinary Catheter (Foley)   LOCAL MEDICATIONS USED:  NONE  SPECIMEN:  Source of Specimen:  placenta  DISPOSITION OF SPECIMEN:  PATHOLOGY  COUNTS:  YES  TOURNIQUET:  * No tourniquets in log *  DICTATION: .Other Dictation: Dictation Number [id number]  PLAN OF CARE: Admit to inpatient   PATIENT DISPOSITION:  PACU - hemodynamically stable.   Delay start of Pharmacological VTE agent (>24hrs) due to surgical blood loss or risk of bleeding: yes

## 2012-02-20 NOTE — Anesthesia Postprocedure Evaluation (Signed)
  Anesthesia Post-op Note  Patient: Carol Dixon  Procedure(s) Performed: Procedure(s) (LRB): CESAREAN SECTION (N/A)  Patient is awake, responsive, moving her legs, and has signs of resolution of her numbness. Pain and nausea are reasonably well controlled. Vital signs are stable and clinically acceptable. Oxygen saturation is clinically acceptable. There are no apparent anesthetic complications at this time. Patient is ready for discharge.

## 2012-02-20 NOTE — Consult Note (Signed)
Asked by Dr Vincente Poli to speak to Ms Spang to address preterm outcome. She is 33 1/7 weeks with severe preeclampsia, received  doses of betamethasone on 4/23 and 4/25. She is scheduled for C/S this afternoon due to severe proteinuria. EFW 1942 gms, 42%.  BPP 8/8.  I spoke to Mrs Wasser and her husband. Her mom and aunt were present also. Discussed resuscitation and general outcome of 33 week preterm. Discussed most common issues such as RDS or resp distress with varying resp support and treatment with surfactant if needed. Discussed nutrition with gavage feeding initially due to gestation and temp regulation. Discussed breastfeeding and benefits. Also discussed LOS.  Thank you for getting [place] involved in discussions with the family before the baby's delivery.

## 2012-02-20 NOTE — Progress Notes (Signed)
Removed epidural catheter per order Dr Jean Rosenthal, cath tip intact, pt tolerated procedure well.

## 2012-02-20 NOTE — Anesthesia Procedure Notes (Signed)
Spinal  Patient location during procedure: OR Preanesthetic Checklist Completed: patient identified, site marked, surgical consent, pre-op evaluation, timeout performed, IV checked, risks and benefits discussed and monitors and equipment checked Spinal Block Patient position: sitting Prep: DuraPrep Patient monitoring: cardiac monitor, continuous pulse ox, blood pressure and heart rate Approach: midline Location: L3-4 Injection technique: catheter Needle Needle type: Tuohy and Sprotte  Needle gauge: 24 G Needle length: 12.7 cm Needle insertion depth: 6 cm Catheter type: closed end flexible Catheter size: 19 g Catheter at skin depth: 12 cm Additional Notes Spinal Dosage in OR  Bupivicaine ml       1.0 PFMS04   mcg        150 Fentanyl mcg            25  Catheter (-) asp CSF

## 2012-02-20 NOTE — Anesthesia Preprocedure Evaluation (Signed)
Anesthesia Evaluation  Patient identified by MRN, date of birth, ID band Patient awake    Reviewed: Allergy & Precautions, H&P , Patient's Chart, lab work & pertinent test results  Airway Mallampati: II TM Distance: >3 FB Neck ROM: full    Dental No notable dental hx.    Pulmonary  breath sounds clear to auscultation  Pulmonary exam normal       Cardiovascular Exercise Tolerance: Good hypertension (No Meds, DBP's 80), Rhythm:regular Rate:Normal     Neuro/Psych    GI/Hepatic   Endo/Other    Renal/GU      Musculoskeletal   Abdominal   Peds  Hematology   Anesthesia Other Findings   Reproductive/Obstetrics                           Anesthesia Physical Anesthesia Plan  ASA: III  Anesthesia Plan: Spinal   Post-op Pain Management:    Induction:   Airway Management Planned:   Additional Equipment:   Intra-op Plan:   Post-operative Plan:   Informed Consent: I have reviewed the patients History and Physical, chart, labs and discussed the procedure including the risks, benefits and alternatives for the proposed anesthesia with the patient or authorized representative who has indicated his/her understanding and acceptance.   Dental Advisory Given  Plan Discussed with: CRNA  Anesthesia Plan Comments: (Lab work confirmed with CRNA in room. Platelets okay. Discussed spinal anesthetic, and patient consents to the procedure:  included risk of possible headache,backache, failed block, allergic reaction, and nerve injury. This patient was asked if she had any questions or concerns before the procedure started. )        Anesthesia Quick Evaluation

## 2012-02-20 NOTE — Progress Notes (Signed)
MFM consult  [name] is a 36 yo G1P0, EDD 04/08/12 currenlty at 33 1/[redacted] weeks gestation - currently admitted due to suspected preeclampsia.  Recent 24 hr urine protein showed 6300 mg protein/24 hr.  Preeclampsia labs otherwise within normal limits.  She reports a 10# weight gain over the last 2 weeks.  She denies any history of chronic hypertension - baseline BP was 110/68 at new OB visit.  She denies HA, visual changes or RUQ pain.  She received a couse of betamethasone recently for fetal lung maturity.  PMH - neg  PSH - Tonsillectomy  ETOH - none while pregnant Tob - 1/2 ppd smoker  Meds - PNV, Iron supplements  BPs (over last 24 hrs)  - 158/94, 171/104, 160/84, 160/99, 115/92  Ultrasound - see report in AS- OB/GYN.  BPP 8/8  Fetal growth appropriate without evidence of IUGR  Impression/ Plan: 1) Severe peeeclampsia- based on proteinuria and now severe range blood pressures.  Recommend moving toward delivery, Magnesium sulfate for seizure prophylaxis.  Discussed route of delivery - at 33 weeks with an unfavorable cervix, would not be unreasonable to offer primary C-section rather than induction of labor.  Briefly discussed NICU admission for the newborn - recommend neonatology consult prior to delivery.  Thank you for this referral.  Alpha Gula, MD Face-to-face time 30 minutes.

## 2012-02-20 NOTE — Progress Notes (Signed)
Discussed with MFM. Recommend delivery today BASED ON PROTEINURIA. Also, patient counseled by myself and MFM on mode of delivery. Given unfavorable cervix and early gestation, she has elected to have a Primary LTCS. Risks reviewed with patient. Given NPO status recommend delivery at 330 pm Magnesium post partum.

## 2012-02-20 NOTE — Progress Notes (Signed)
Patient is resting comfortably She does complain of a dull headache. BP 171/104 Fetal heart rate has been reactive Toco No contractions Abdomen is soft and non tender Recent 24 hour urine reviewed  IMPRESSION: IUP at 33 1/7 Severe Preeclampsia  PLAN: Repeat CBC, CMET MFM consult this am with BPP and AFI to discuss indications for delivery Plan discussed with patient and her husband

## 2012-02-20 NOTE — Transfer of Care (Signed)
Immediate Anesthesia Transfer of Care Note  Patient: Carol Dixon  Procedure(s) Performed: Procedure(s) (LRB): CESAREAN SECTION (N/A)  Patient Location: PACU  Anesthesia Type: Spinal  Level of Consciousness: awake, alert  and oriented  Airway & Oxygen Therapy: Patient Spontanous Breathing  Post-op Assessment: Report given to PACU RN and Post -op Vital signs reviewed and stable  Post vital signs: Reviewed and stable  Complications: No apparent anesthesia complications

## 2012-02-21 ENCOUNTER — Encounter (HOSPITAL_COMMUNITY): Payer: Self-pay | Admitting: Obstetrics and Gynecology

## 2012-02-21 LAB — COMPREHENSIVE METABOLIC PANEL
ALT: 12 U/L (ref 0–35)
AST: 15 U/L (ref 0–37)
Albumin: 1.8 g/dL — ABNORMAL LOW (ref 3.5–5.2)
CO2: 26 mEq/L (ref 19–32)
Calcium: 8.1 mg/dL — ABNORMAL LOW (ref 8.4–10.5)
Creatinine, Ser: 0.62 mg/dL (ref 0.50–1.10)
Sodium: 137 mEq/L (ref 135–145)

## 2012-02-21 LAB — CBC
Hemoglobin: 10.9 g/dL — ABNORMAL LOW (ref 12.0–15.0)
Platelets: 166 10*3/uL (ref 150–400)
RBC: 3.53 MIL/uL — ABNORMAL LOW (ref 3.87–5.11)
WBC: 13.3 10*3/uL — ABNORMAL HIGH (ref 4.0–10.5)

## 2012-02-21 MED ORDER — RHO D IMMUNE GLOBULIN 1500 UNIT/2ML IJ SOLN
300.0000 ug | Freq: Once | INTRAMUSCULAR | Status: AC
  Administered 2012-02-21: 300 ug via INTRAMUSCULAR
  Filled 2012-02-21: qty 2

## 2012-02-21 MED ORDER — LACTATED RINGERS IV SOLN
INTRAVENOUS | Status: DC
  Administered 2012-02-21: 01:00:00 via INTRAVENOUS

## 2012-02-21 NOTE — Progress Notes (Signed)
UR chart review completed.  

## 2012-02-21 NOTE — Progress Notes (Signed)
Pt. Is stable and ready for transport to the Women's Unit. I gave report to Bella[place], Charity fundraiser. All of the patient's belongings are with the patient/family. She ambulated to her new room, but felt a lot of discomfort at the incision site, possible gas pains. Incision site is clean, dry, intact, and open to air. Dr. Rana Snare approved the transfer. Elink notified.  Milinda Cave, RN

## 2012-02-21 NOTE — Progress Notes (Signed)
Subjective: Postpartum Day 1: Cesarean Delivery Patient reports tolerating PO and + flatus.    Objective: Vital signs in last 24 hours: Temp:  [97.6 F (36.4 C)-98.9 F (37.2 C)] 97.6 F (36.4 C) (05/07 0800) Pulse Rate:  [73-97] 79  (05/07 0800) Resp:  [16-25] 16  (05/07 0800) BP: (116-158)/(61-94) 147/84 mmHg (05/07 0800) SpO2:  [92 %-99 %] 98 % (05/07 0800) Weight:  [82.101 kg (181 lb)] 82.101 kg (181 lb) (05/06 1900)  Physical Exam:  General: alert, cooperative and no distress Lochia: appropriate Uterine Fundus: firm Incision: bandage dry DVT Evaluation: No evidence of DVT seen on physical exam. DTRs 2/4   Basename 02/21/12 0520 02/20/12 1417  HGB 10.9* 11.9*  HCT 32.8* 35.1*    Assessment/Plan: Status post Cesarean section. Postoperative course complicated by Severe Preeclampsia  Now with stable labs, good UOP and no CNS Sxs Will dc mag and cont routine post op care Baby boy stable in NICU on room air.  Buffie Herne C 02/21/2012, 9:08 AM

## 2012-02-21 NOTE — Op Note (Signed)
[name], Carol Dixon                 ACCOUNT NO.:  [id number]  MEDICAL RECORD NO.:  [id number]  LOCATION:  9372                          FACILITY:  [facility]  PHYSICIAN:  Giani Winther L. Evlyn Amason, M.D.DATE OF BIRTH:  Aug 12, 1976  DATE OF PROCEDURE:  02/20/2012 DATE OF DISCHARGE:                              OPERATIVE REPORT   PREOPERATIVE DIAGNOSIS:  Severe preeclampsia and intrauterine pregnancy at 33 and 1.  POSTOPERATIVE DIAGNOSIS:  Severe preeclampsia and intrauterine pregnancy at 33 and 1.  PROCEDURE:  Primary low-transverse cesarean section.  SURGEON:  Serenitie Vinton L. Gedeon Brandow, MD  ANESTHESIA:  Spinal.  EBL:  Less than 500.  COMPLICATIONS:  None.  FINDINGS:  Female infant in cephalic presentation, Apgars were 8 at 1 minute and 9 at 5 minutes.  Went to the NICU secondary to prematurity.  COMPLICATIONS:  None.  PATHOLOGY:  Placenta.  PROCEDURE:  The patient was taken to the operating room where spinal was placed without incident, and she was prepped and draped and a Foley catheter was inserted.  A low-transverse incision was made after Allis test was performed, carried down to the fascia.  Fascia was scored in the midline and extended laterally.  Rectus muscles were separated in midline.  The peritoneum was entered bluntly.  Peritoneal incision was then stretched.  The bladder blade was inserted.  The lower uterine segment was identified and the bladder flap was created sharply and then digitally.  The bladder blade was then readjusted.  A low-transverse incision was made in the uterus.  Uterus was entered using a hemostat. The baby was in cephalic presentation with a female infant with a shoulder cord and was delivered easily.  Apgars were 8 at 1 minute and 9 at 5 minutes.  The cord was clamped and cut.  The baby was handed to the awaiting neonatal team.  The placenta was manually removed, noted to be normal intact with the three-vessel cord.  The uterus was exteriorized and cleared of  all clots and debris.  The uterine incision was closed in 1 layer using 0 chromic in a running locked stitch.  The uterus was returned to the abdomen.  Irrigation was performed.  Hemostasis was excellent.  The peritoneum was closed using 0 Vicryl in a running stitch and the rectus muscles were reapproximated with 0 Vicryl.  The fascia was closed using 0 Vicryl in a running stitch x2 starting at each corner meeting in the midline.  After irrigation of subcutaneous layer, the skin was closed with staples.  All sponge, lap, and instrument counts were correct x2.  The patient went to recovery room in stable condition and then will go to the AICU, on magnesium.     Ovide Dusek L. Vincente Poli, M.D.     Florestine Avers  D:  02/20/2012  T:  02/21/2012  Job:  [id number]

## 2012-02-22 LAB — CBC
HCT: 32.2 % — ABNORMAL LOW (ref 36.0–46.0)
HCT: 33 % — ABNORMAL LOW (ref 36.0–46.0)
Hemoglobin: 10.6 g/dL — ABNORMAL LOW (ref 12.0–15.0)
Hemoglobin: 10.9 g/dL — ABNORMAL LOW (ref 12.0–15.0)
MCH: 31 pg (ref 26.0–34.0)
MCHC: 32.9 g/dL (ref 30.0–36.0)
MCV: 93.5 fL (ref 78.0–100.0)
RDW: 15.9 % — ABNORMAL HIGH (ref 11.5–15.5)
WBC: 11.9 10*3/uL — ABNORMAL HIGH (ref 4.0–10.5)

## 2012-02-22 LAB — RH IG WORKUP (INCLUDES ABO/RH)
Antibody Screen: POSITIVE
Fetal Screen: NEGATIVE
Gestational Age(Wks): 33

## 2012-02-22 LAB — COMPREHENSIVE METABOLIC PANEL
Albumin: 1.9 g/dL — ABNORMAL LOW (ref 3.5–5.2)
Alkaline Phosphatase: 147 U/L — ABNORMAL HIGH (ref 39–117)
BUN: 13 mg/dL (ref 6–23)
BUN: 10 mg/dL (ref 6–23)
CO2: 22 mEq/L (ref 19–32)
Calcium: 8.3 mg/dL — ABNORMAL LOW (ref 8.4–10.5)
Chloride: 101 mEq/L (ref 96–112)
Creatinine, Ser: 0.61 mg/dL (ref 0.50–1.10)
GFR calc Af Amer: >90 mL/min (ref >90)
GFR calc Af Amer: >90 mL/min (ref >90)
GFR calc non Af Amer: >90 mL/min (ref >90)
Glucose, Bld: 73 mg/dL (ref 70–99)
Glucose, Bld: 94 mg/dL (ref 70–99)
Potassium: 4.5 mEq/L (ref 3.5–5.1)
Potassium: 3.5 mEq/L (ref 3.5–5.1)
Sodium: 136 mEq/L (ref 135–145)
Total Bilirubin: 0.1 mg/dL — ABNORMAL LOW (ref 0.3–1.2)
Total Protein: 5.3 g/dL — ABNORMAL LOW (ref 6.0–8.3)

## 2012-02-22 LAB — URIC ACID: Uric Acid, Serum: 5.3 mg/dL (ref 2.4–7.0)

## 2012-02-22 MED ORDER — LACTATED RINGERS IV SOLN
INTRAVENOUS | Status: DC
  Administered 2012-02-22 – 2012-02-23 (×2): via INTRAVENOUS

## 2012-02-22 MED ORDER — AMPICILLIN-SULBACTAM SODIUM 3 (2-1) G IJ SOLR
3.0000 g | Freq: Four times a day (QID) | INTRAMUSCULAR | Status: DC
  Administered 2012-02-22 – 2012-02-25 (×10): 3 g via INTRAVENOUS
  Filled 2012-02-22 (×14): qty 3

## 2012-02-22 NOTE — Progress Notes (Signed)
Pt C/O LLQ pain with movement.  Had HA about 1 hour ago-now much better.  No vision change, no epigastric pain.  Blood pressure 166/90, pulse 97, temperature 100.5 F (38.1 C), temperature source Oral, resp. rate 20, height 4\' 10"  (1.473 m), weight 82.101 kg (181 lb), last menstrual period 06/28/2011, SpO2 99.00%, unknown if currently breastfeeding.  Lungs CTA Cor RRR Abd soft, no epigastric pain        Uterus firm, ++ tender DTR 1+, no clonus  A: Temperature drifting up despite ibuprofen/tylenol, uterus tender (not tender this am), suspicious for endometritis      BP drifting up with fever     HA now better  P: Begin Unasyn      Check PIH labs      Watch BP and HA closely-if persists or worsens, will begin magnesium sulfate

## 2012-02-22 NOTE — Progress Notes (Signed)
Subjective: Postpartum Day 2: Cesarean Delivery Patient reports tolerating PO, + flatus and no problems voiding.    Objective: Vital signs in last 24 hours: Temp:  [98.1 F (36.7 C)-98.8 F (37.1 C)] 98.6 F (37 C) (05/08 0553) Pulse Rate:  [75-94] 83  (05/08 0553) Resp:  [16-18] 18  (05/08 0553) BP: (125-160)/(69-111) 125/88 mmHg (05/08 0553) SpO2:  [96 %-100 %] 96 % (05/08 0553)  Physical Exam:  General: alert and cooperative Lochia: appropriate Uterine Fundus: firm Incision: healing well, staples intact DVT Evaluation: No evidence of DVT seen on physical exam.   Basename 02/22/12 0525 02/21/12 0520  HGB 10.6* 10.9*  HCT 32.2* 32.8*    Assessment/Plan: Status post Cesarean section. Doing well postoperatively.  Continue current care.  Daquavion Catala G 02/22/2012, 8:30 AM

## 2012-02-22 NOTE — Clinical Social Work Maternal (Signed)
Clinical Social Work Department PSYCHOSOCIAL ASSESSMENT - MATERNAL/CHILD 02/22/2012  Patient:  Carol Dixon, Carol Dixon  Account Number:  [id number]  Admit Date:  02/17/2012  Marjo Bicker Name:   Carol Dixon    Clinical Social Worker:  Lulu Riding, LCSW   Date/Time:  02/22/2012 11:40 AM  Date Referred:  02/22/2012   Referral source  NICU     Referred reason  NICU   Other referral source:    I:  FAMILY / HOME ENVIRONMENT Child's legal guardian:  PARENT  Guardian - Name Guardian - Age Guardian - Address  Carol Dixon [address] [place], [place] [address]  Carol Dixon 43 same   Other household support members/support persons Other support:   FOB reports that they have a great support system of family and friends in the area.    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 Milford Cage   IV  RISK FACTORS AND CURRENT PROBLEMS Current Problem:  None   Risk Factor & Current Problem Patient Issue Family Issue Risk Factor / Current Problem Comment   N N     V  SOCIAL WORK ASSESSMENT SW met with parents in MOB's third floor room/303 to introduce myself, complete assessment and evaluate how they are coping with baby's admission to NICU.  They were pleasant and spoke with SW, but MOB was somewhat groggy from pain medication, and FOB seemed to have difficulty discussing anything other than  the present moment.  SW encouraged them to continue to take things one day at a time and asked if they had any questions about the NICU, who they have met, or what to expect.  MOB wanted to know how long the typical NICU stay is.  SW explained that there is no typical timeframe because every baby is different and determines the course of treatment and when they are ready to go home.  SW recommended that they keep their due date in mind to try to avoid getting discouraged.  They stated understanding.  MOB asked about when to return to work since she will not want to go back to work soon after baby comes home.  SW explained that in some cases, mothers decide to go back to work once they are cleared by their doctor and before baby is discharged to save some time, but that she will have to talk with her Human Resources department to inquire about this.  Parents had questions about the birth certificate so SW get them in touch with the birth Passenger transport manager.  SW informed parents of the [employer] as a resource since they live out of town.  SW explained support services offered by NICU SW and gave contact information.  SW asked parents to let SW know if they want to have a family conference at any time to ensure that questions are answered and parents are feeling comfortable with the situation.  Parents seemed appreciative of SW's visit, although possibly apprehensive at first as to why a Child psychotherapist was meeting with them.      VI SOCIAL WORK PLAN Social Work Plan  Psychosocial Support/Ongoing Assessment of Needs   Type of pt/family education:   If child protective services report - county:   If child protective services report - date:   Information/referral to community resources comment:   Other social work plan:

## 2012-02-23 LAB — MRSA CULTURE

## 2012-02-23 MED ORDER — SERTRALINE HCL 50 MG PO TABS
50.0000 mg | ORAL_TABLET | Freq: Every day | ORAL | Status: DC
  Administered 2012-02-23 – 2012-02-25 (×3): 50 mg via ORAL
  Filled 2012-02-23 (×4): qty 1

## 2012-02-23 NOTE — Progress Notes (Signed)
We received a referral from Franchot Heidelberg to see family because of baby's health situation.  Family was tearful and anxious about baby Landon's condition.  They are also experiencing sadness at not being able to hold their son.  I offered spiritual and emotional support as well as prayer.    We will continue to follow family.  Please page as needed.  [phone]  Chaplain Katy Yesenia Locurto 10:26 AM

## 2012-02-23 NOTE — Progress Notes (Signed)
Dr Henderson Cloud called to check on patient,  Denies headache, T 99.5, b/p decreased,  IV started with antibiotic infusing at this time.

## 2012-02-23 NOTE — Progress Notes (Signed)
Pt c/o redness and soreness on her right upper arm. Pt states she received the TDAP vaccine yesterday and states it has been hurting ever since.  Pt right upper arm, red, warm to touch and tender.  Area marked with pen. Ice pack applied. Will continue to monitor.

## 2012-02-23 NOTE — Progress Notes (Signed)
Pt right upper arm, red, tender and warm to touch. Ice pack applied. Area marked with pen. Dr. Renaldo Fiddler notified. No new orders received. Will continue to monitor.

## 2012-02-23 NOTE — Progress Notes (Cosign Needed)
Subjective: Postpartum Day 2: Cesarean Delivery Patient reports incisional pain, tolerating PO, + flatus, + BM and no problems voiding.  Reports panic attack during the night. Baby in NICU on vent.  Objective: Vital signs in last 24 hours: Temp:  [97.8 F (36.6 C)-100.5 F (38.1 C)] 98.3 F (36.8 C) (05/09 0611) Pulse Rate:  [84-103] 85  (05/09 0611) Resp:  [18-20] 18  (05/09 0611) BP: (128-167)/(82-97) 134/82 mmHg (05/09 0611) SpO2:  [91 %-99 %] 97 % (05/09 0611) Weight:  [80.939 kg (178 lb 7 oz)] 80.939 kg (178 lb 7 oz) (05/09 8657)  Physical Exam:  General: alert, cooperative and fatigued Lochia: appropriate Uterine Fundus: firm Incision: healing well, staples intact DVT Evaluation: No evidence of DVT seen on physical exam.  DTR's 1+ , small pedal edema continues, however improving   Basename 02/22/12 2020 02/22/12 0525  HGB 10.9* 10.6*  HCT 33.0* 32.2*  LFT's WNL  Assessment/Plan: Status post Cesarean section. Postoperative course complicated by fever and anxiety  Continue Unasyn Start Zoloft.  Carol Dixon G 02/23/2012, 8:22 AM

## 2012-02-24 MED ORDER — LABETALOL HCL 200 MG PO TABS
200.0000 mg | ORAL_TABLET | Freq: Two times a day (BID) | ORAL | Status: DC
  Administered 2012-02-24 – 2012-02-25 (×2): 200 mg via ORAL
  Filled 2012-02-24 (×4): qty 1

## 2012-02-24 MED ORDER — LABETALOL HCL 200 MG PO TABS
200.0000 mg | ORAL_TABLET | Freq: Once | ORAL | Status: AC
  Administered 2012-02-24: 200 mg via ORAL
  Filled 2012-02-24: qty 1

## 2012-02-24 NOTE — Progress Notes (Signed)
UR Chart review completed.  

## 2012-02-24 NOTE — Progress Notes (Signed)
Contacted Dr. Vincente Poli about pt high blood pressures.  Received new orders to give labetalol 200 mg BID.  Will continue to monitor.

## 2012-02-24 NOTE — Progress Notes (Signed)
Right deltoid decreased reddness, not as hot, pt stated less sore at this time.  Ice pack continue

## 2012-02-24 NOTE — Progress Notes (Signed)
Subjective: Postpartum Day 2: Cesarean Delivery Patient reports tolerating PO, + flatus and no problems voiding.  She is still noticing some redness and warmth at the site of the shot in her right deltoid. Her entire arm is sore. She is also having some weakness and headache. Baby is in the NICU on a vent and with chest tubes secondary to pneumothorax. Also, she has started Zoloft secondary to some anxiety.   Objective: Vital signs in last 24 hours: Temp:  [97.5 F (36.4 C)-98.5 F (36.9 C)] 98 F (36.7 C) (05/10 0605) Pulse Rate:  [84-99] 87  (05/10 0605) Resp:  [18-20] 20  (05/10 0605) BP: (145-164)/(86-101) 164/88 mmHg (05/10 0605) SpO2:  [95 %-98 %] 98 % (05/10 0605) Weight:  [79.124 kg (174 lb 7 oz)] 79.124 kg (174 lb 7 oz) (05/10 1610)  Physical Exam:  General: alert, cooperative and appears stated age 36: appropriate Uterine Fundus: firm Incision: healing well, no significant drainage, no dehiscence, no significant erythema DVT Evaluation: No evidence of DVT seen on physical exam. Exam of right deltoid area - definite well demarcated area at injection site that is raised and erythematous and tender and warm. Some of the erythema extends beyond the inferior of the marked area  [facility] 02/22/12 2020 02/22/12 0525  HGB 10.9* 10.6*  HCT 33.0* 32.2*    Assessment/Plan: Status post Cesarean section. Postoperative course complicated by cellutis at deltoid region and persistent hypertension  Severe Preeclampsia start Labetalol Patient needs continued inpatient hospitalization - continue IV antibiotics secondary to cellulitis and to monitor blood pressure since I am starting her on medications today. Continue current care.  Carol Dixon L 02/24/2012, 7:59 AM

## 2012-02-25 MED ORDER — SERTRALINE HCL 50 MG PO TABS: 50.0000 mg | ORAL_TABLET | Freq: Every day | ORAL | Status: DC

## 2012-02-25 MED ORDER — CEPHALEXIN 500 MG PO CAPS: 500.0000 mg | ORAL_CAPSULE | Freq: Four times a day (QID) | ORAL | Status: AC

## 2012-02-25 MED ORDER — IBUPROFEN 600 MG PO TABS: 600.0000 mg | ORAL_TABLET | Freq: Four times a day (QID) | ORAL | Status: AC | PRN

## 2012-02-25 MED ORDER — LABETALOL HCL 200 MG PO TABS: 200.0000 mg | ORAL_TABLET | Freq: Two times a day (BID) | ORAL | Status: DC

## 2012-02-25 MED ORDER — OXYCODONE-ACETAMINOPHEN 5-325 MG PO TABS: 1.0000 | ORAL_TABLET | ORAL | Status: AC | PRN

## 2012-02-25 MED ORDER — SENNOSIDES-DOCUSATE SODIUM 8.6-50 MG PO TABS: 2.0000 | ORAL_TABLET | Freq: Every day | ORAL | Status: AC

## 2012-02-25 NOTE — Progress Notes (Signed)
D/C instructions and prescriptions reviewed with pt. Pt verbalized understanding. Prescriptions (ibuprofen,senokot,keflex,labetalol and zoloft) called into [facility] in [place], [place]. Pt instructed to take RX for percocet to the pharmacy to get filled.

## 2012-02-25 NOTE — Progress Notes (Signed)
Subjective: Postpartum Day 4: Cesarean Delivery Patient reports incisional pain, tolerating PO and no problems voiding.  Redness over right deltoid is resolved. Objective: Vital signs in last 24 hours: Temp:  [97.3 F (36.3 C)-98.3 F (36.8 C)] 98.1 F (36.7 C) (05/11 0400) Pulse Rate:  [74-89] 74  (05/11 0400) Resp:  [18-20] 20  (05/11 0400) BP: (116-159)/(79-93) 134/86 mmHg (05/11 0400) SpO2:  [96 %-100 %] 97 % (05/11 0400)  Physical Exam:  General: alert, cooperative and appears stated age 36: appropriate Uterine Fundus: firm Incision: healing well, no significant drainage, no dehiscence, no significant erythema DVT Evaluation: No evidence of DVT seen on physical exam.   Basename 02/22/12 2020  HGB 10.9*  HCT 33.0*    Assessment/Plan: Status post Cesarean section. Doing well postoperatively.  Discharge home with standard precautions and return to clinic in 1 week.  Giancarlos Berendt L 02/25/2012, 9:54 AM

## 2012-02-25 NOTE — Progress Notes (Signed)
D/C staples per MD order. Steri strip applied. Pt tolerated well. Will continue to monitor.

## 2012-02-25 NOTE — Discharge Summary (Signed)
Obstetric Discharge Summary Reason for Admission: severe preeclampsia Prenatal Procedures: Preeclampsia Intrapartum Procedures: cesarean: low cervical, transverse Postpartum Procedures: antibiotics Complications-Operative and Postpartum: cellulitis of right deltoid Hemoglobin  Date Value Range Status  02/22/2012 10.9* 12.0-15.0 (g/dL) Final     HCT  Date Value Range Status  02/22/2012 33.0* 36.0-46.0 (%) Final    Physical Exam:  General: alert Lochia: appropriate Uterine Fundus: firm Incision: healing well, no significant drainage, no dehiscence, no significant erythema DVT Evaluation: No evidence of DVT seen on physical exam.  Discharge Diagnoses: Preelampsia  Discharge Information: Date: 02/25/2012 Activity: pelvic rest Diet: routine Medications: Ibuprofen, Colace, Percocet and keflex, labetalol Condition: improved Instructions: refer to practice specific booklet Discharge to: home   Newborn Data: Live born female  Birth Weight: 3 lb 14.7 oz (1778 g) APGAR: 8, 9  Home with nicu.  Joanie Duprey L 02/25/2012, 9:55 AM

## 2012-06-07 IMAGING — US US OB COMP LESS 14 WK
1 series · 14 of 28 positions shown · non-contrast
Comparison: none

[Series 1: us ob comp less 14 wk · 0.16mm/px · 14 of 34 slices shown]
[im 2/34]
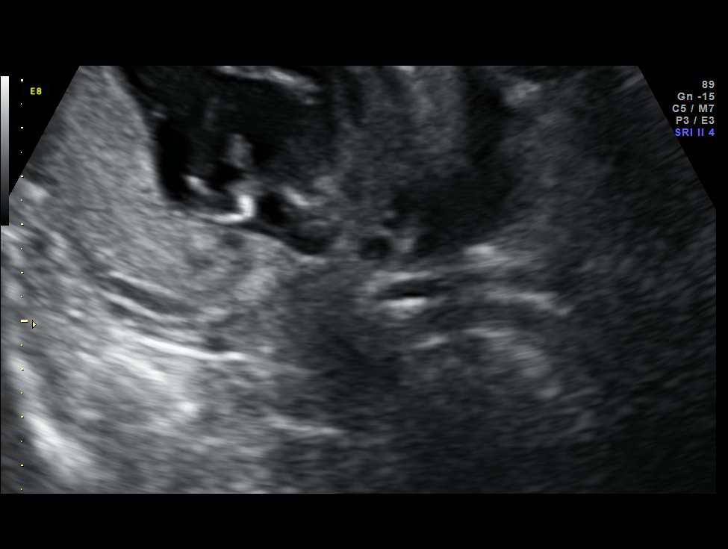
[im 4/34]
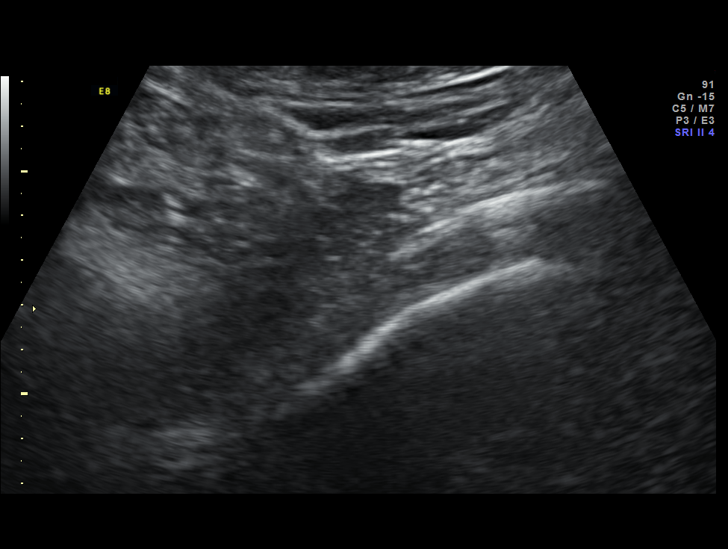
[im 7/34]
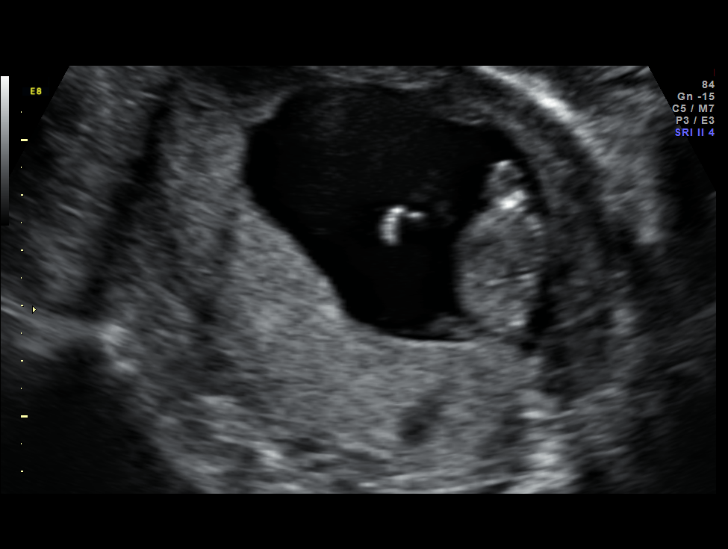
[im 9/34]
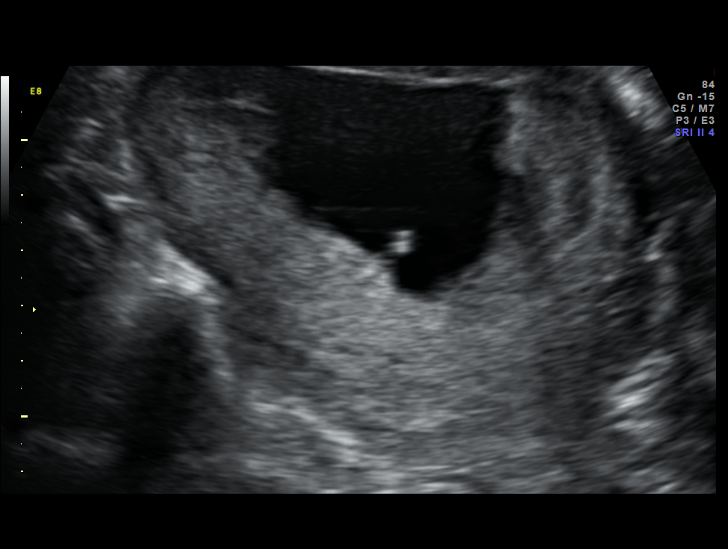
[im 12/34]
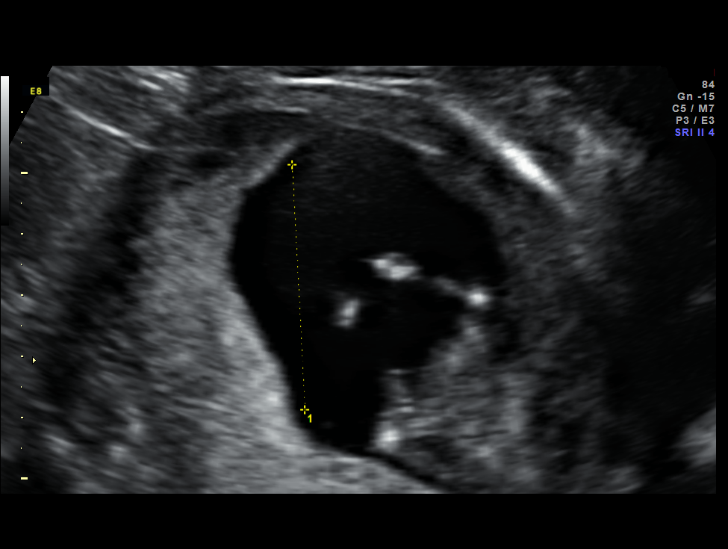
[im 14/34]
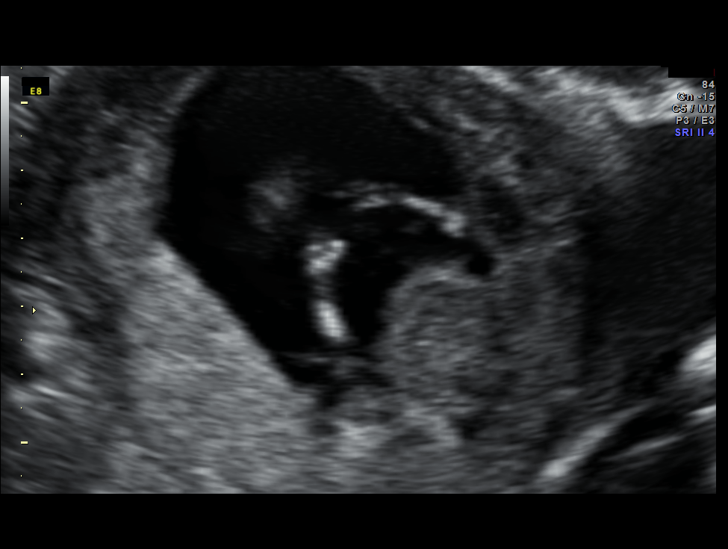
[im 16/34]
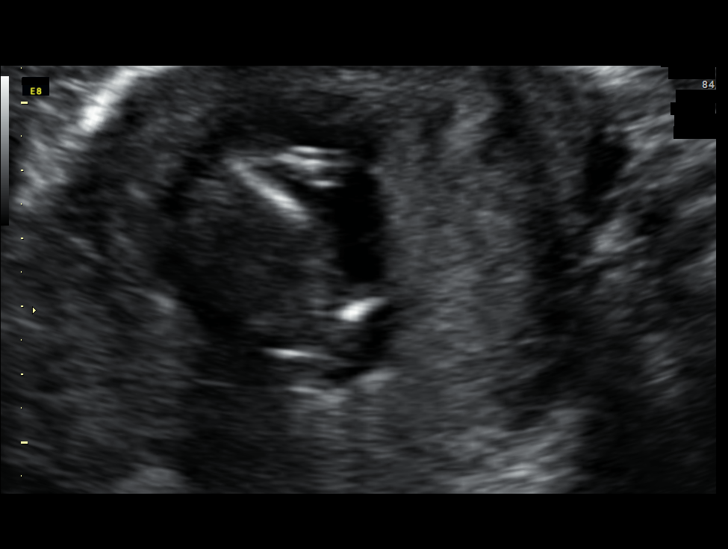
[im 19/34]
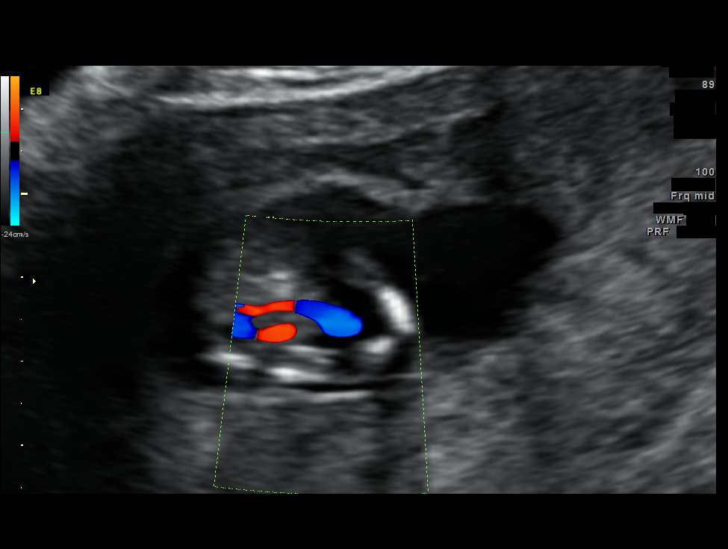
[im 21/34]
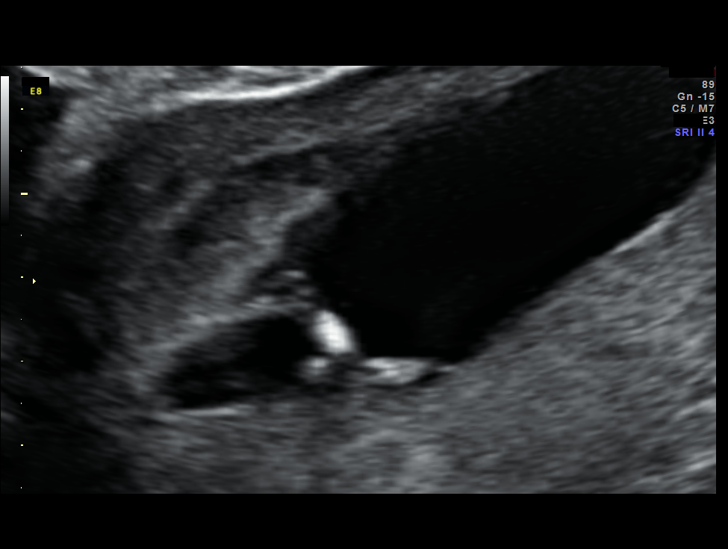
[im 24/34]
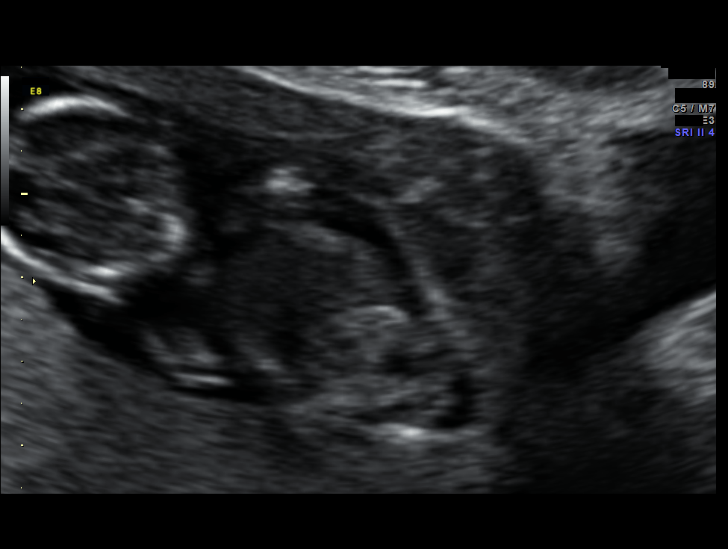
[im 26/34]
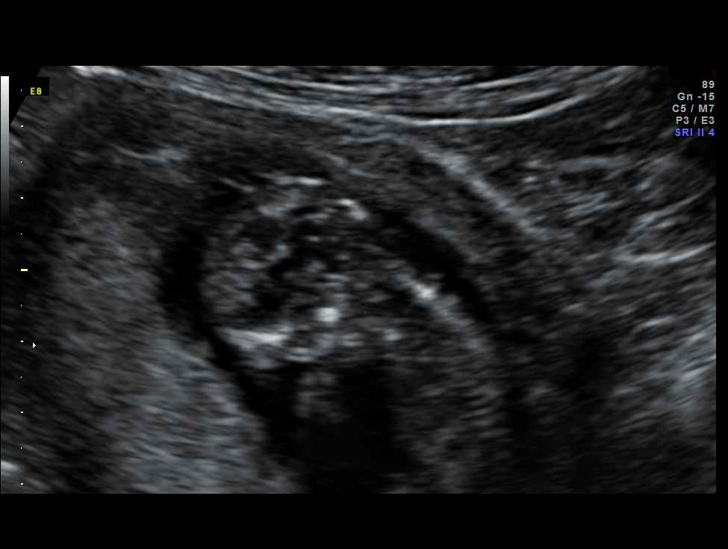
[im 29/34]
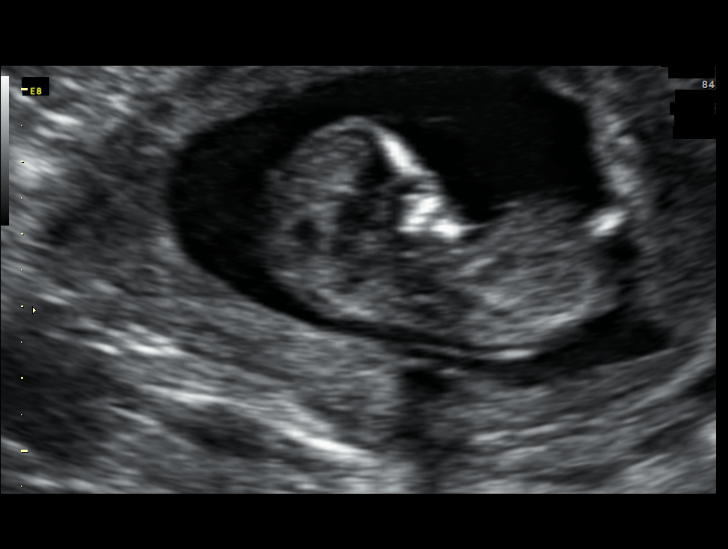
[im 31/34]
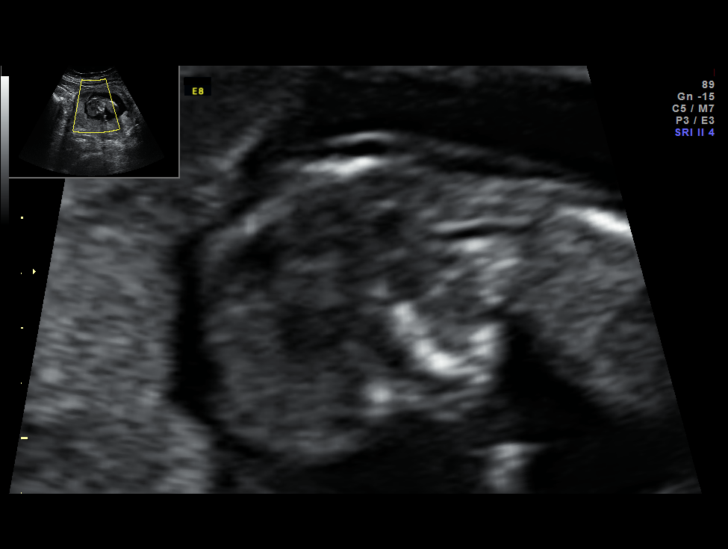
[im 34/34]
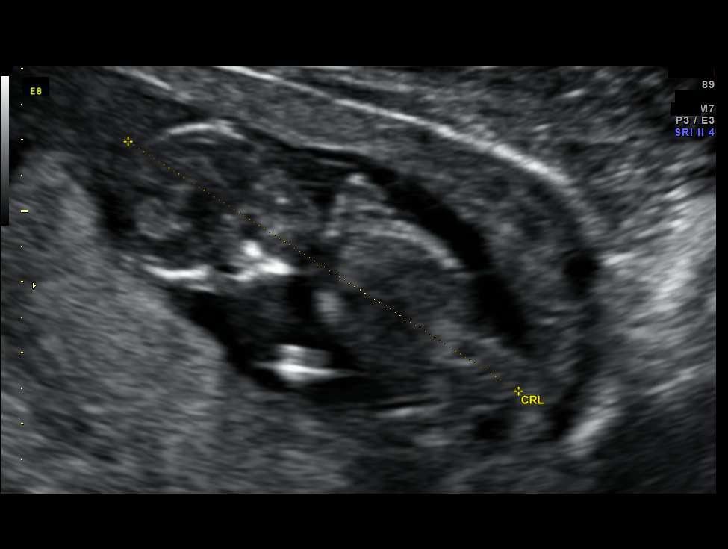

[14 of 28 positions shown; findings below may reference images not displayed]

Canned report from images found in remote index.

Refer to host system for actual result text.

## 2012-07-03 ENCOUNTER — Encounter (HOSPITAL_COMMUNITY): Payer: Self-pay

## 2014-08-18 ENCOUNTER — Encounter (HOSPITAL_COMMUNITY): Payer: Self-pay | Admitting: Obstetrics and Gynecology

## 2014-09-16 DIAGNOSIS — Z98811 Dental restoration status: Secondary | ICD-10-CM

## 2014-09-16 DIAGNOSIS — N62 Hypertrophy of breast: Secondary | ICD-10-CM

## 2014-09-16 HISTORY — DX: Dental restoration status: Z98.811

## 2014-09-16 HISTORY — DX: Hypertrophy of breast: N62

## 2014-10-07 ENCOUNTER — Encounter (HOSPITAL_BASED_OUTPATIENT_CLINIC_OR_DEPARTMENT_OTHER): Payer: Self-pay | Admitting: *Deleted

## 2014-10-14 ENCOUNTER — Ambulatory Visit (HOSPITAL_BASED_OUTPATIENT_CLINIC_OR_DEPARTMENT_OTHER): Payer: BC Managed Care – PPO | Admitting: Certified Registered"

## 2014-10-14 ENCOUNTER — Encounter (HOSPITAL_BASED_OUTPATIENT_CLINIC_OR_DEPARTMENT_OTHER): Payer: Self-pay

## 2014-10-14 ENCOUNTER — Ambulatory Visit (HOSPITAL_BASED_OUTPATIENT_CLINIC_OR_DEPARTMENT_OTHER)
Admission: RE | Admit: 2014-10-14 | Discharge: 2014-10-14 | Disposition: A | Discharge status: Home / Self Care | Payer: BC Managed Care – PPO | Source: Ambulatory Visit | Attending: Plastic Surgery | Admitting: Plastic Surgery

## 2014-10-14 ENCOUNTER — Encounter (HOSPITAL_BASED_OUTPATIENT_CLINIC_OR_DEPARTMENT_OTHER)
Admission: RE | Disposition: A | Discharge status: Home / Self Care | Payer: Self-pay | Source: Ambulatory Visit | Attending: Plastic Surgery

## 2014-10-14 DIAGNOSIS — N62 Hypertrophy of breast: Secondary | ICD-10-CM | POA: Insufficient documentation

## 2014-10-14 DIAGNOSIS — K219 Gastro-esophageal reflux disease without esophagitis: Secondary | ICD-10-CM | POA: Insufficient documentation

## 2014-10-14 DIAGNOSIS — F17210 Nicotine dependence, cigarettes, uncomplicated: Secondary | ICD-10-CM | POA: Diagnosis not present

## 2014-10-14 HISTORY — DX: Dental restoration status: Z98.811

## 2014-10-14 HISTORY — PX: BREAST REDUCTION SURGERY: SHX8

## 2014-10-14 HISTORY — DX: Gastro-esophageal reflux disease without esophagitis: K21.9

## 2014-10-14 HISTORY — DX: Personal history of other diseases of the digestive system: Z87.19

## 2014-10-14 HISTORY — DX: Hypertrophy of breast: N62

## 2014-10-14 HISTORY — DX: Personal history of peptic ulcer disease: Z87.11

## 2014-10-14 LAB — POCT HEMOGLOBIN-HEMACUE: Hemoglobin: 12.6 g/dL (ref 12.0–15.0)

## 2014-10-14 SURGERY — MAMMOPLASTY, REDUCTION
Anesthesia: General | Laterality: Bilateral

## 2014-10-14 MED ORDER — PROPOFOL 10 MG/ML IV BOLUS
INTRAVENOUS | Status: DC | PRN
  Administered 2014-10-14: 200 mg via INTRAVENOUS

## 2014-10-14 MED ORDER — CEFAZOLIN SODIUM-DEXTROSE 2-3 GM-% IV SOLR
INTRAVENOUS | Status: AC
  Filled 2014-10-14: qty 50

## 2014-10-14 MED ORDER — BUPIVACAINE LIPOSOME 1.3 % IJ SUSP
INTRAMUSCULAR | Status: AC
  Filled 2014-10-14: qty 20

## 2014-10-14 MED ORDER — OXYCODONE HCL 5 MG PO TABS
ORAL_TABLET | ORAL | Status: AC
  Filled 2014-10-14: qty 1

## 2014-10-14 MED ORDER — ALBUTEROL SULFATE HFA 108 (90 BASE) MCG/ACT IN AERS
INHALATION_SPRAY | RESPIRATORY_TRACT | Status: DC | PRN
  Administered 2014-10-14: 4 via RESPIRATORY_TRACT

## 2014-10-14 MED ORDER — SUCCINYLCHOLINE CHLORIDE 20 MG/ML IJ SOLN
INTRAMUSCULAR | Status: DC | PRN
  Administered 2014-10-14: 100 mg via INTRAVENOUS

## 2014-10-14 MED ORDER — OXYCODONE HCL 5 MG/5ML PO SOLN: 5.0000 mg | Freq: Once | ORAL | Status: AC | PRN

## 2014-10-14 MED ORDER — LIDOCAINE-EPINEPHRINE 1 %-1:100000 IJ SOLN
INTRAMUSCULAR | Status: AC
  Filled 2014-10-14: qty 2

## 2014-10-14 MED ORDER — HYDROMORPHONE HCL 1 MG/ML IJ SOLN
INTRAMUSCULAR | Status: AC
  Filled 2014-10-14: qty 1

## 2014-10-14 MED ORDER — SUCCINYLCHOLINE CHLORIDE 20 MG/ML IJ SOLN
INTRAMUSCULAR | Status: AC
  Filled 2014-10-14: qty 1

## 2014-10-14 MED ORDER — LIDOCAINE HCL (CARDIAC) 20 MG/ML IV SOLN
INTRAVENOUS | Status: DC | PRN
  Administered 2014-10-14: 30 mg via INTRAVENOUS

## 2014-10-14 MED ORDER — MIDAZOLAM HCL 5 MG/5ML IJ SOLN
INTRAMUSCULAR | Status: DC | PRN
  Administered 2014-10-14: 2 mg via INTRAVENOUS

## 2014-10-14 MED ORDER — LACTATED RINGERS IV SOLN
INTRAVENOUS | Status: DC
  Administered 2014-10-14 (×2): via INTRAVENOUS

## 2014-10-14 MED ORDER — EPHEDRINE SULFATE 50 MG/ML IJ SOLN
INTRAMUSCULAR | Status: DC | PRN
  Administered 2014-10-14: 10 mg via INTRAVENOUS

## 2014-10-14 MED ORDER — MIDAZOLAM HCL 2 MG/2ML IJ SOLN
INTRAMUSCULAR | Status: AC
  Filled 2014-10-14: qty 2

## 2014-10-14 MED ORDER — MIDAZOLAM HCL 2 MG/2ML IJ SOLN: 1.0000 mg | INTRAMUSCULAR | Status: DC | PRN

## 2014-10-14 MED ORDER — BACITRACIN ZINC 500 UNIT/GM EX OINT
TOPICAL_OINTMENT | CUTANEOUS | Status: AC
  Filled 2014-10-14: qty 28.35

## 2014-10-14 MED ORDER — BACITRACIN ZINC 500 UNIT/GM EX OINT
TOPICAL_OINTMENT | CUTANEOUS | Status: DC | PRN
  Administered 2014-10-14: 1 via TOPICAL

## 2014-10-14 MED ORDER — ONDANSETRON HCL 4 MG/2ML IJ SOLN
INTRAMUSCULAR | Status: DC | PRN
  Administered 2014-10-14: 4 mg via INTRAVENOUS

## 2014-10-14 MED ORDER — SODIUM CHLORIDE 0.9 % IV SOLN
INTRAVENOUS | Status: DC | PRN
  Administered 2014-10-14 (×2): 80 mL via INTRAMUSCULAR

## 2014-10-14 MED ORDER — CEFAZOLIN SODIUM 1-5 GM-% IV SOLN
1.0000 g | Freq: Once | INTRAVENOUS | Status: AC
  Administered 2014-10-14: 2 g via INTRAVENOUS

## 2014-10-14 MED ORDER — MIDAZOLAM HCL 2 MG/ML PO SYRP: 12.0000 mg | ORAL_SOLUTION | Freq: Once | ORAL | Status: DC | PRN

## 2014-10-14 MED ORDER — LIDOCAINE-EPINEPHRINE 1 %-1:100000 IJ SOLN
INTRAMUSCULAR | Status: DC | PRN
  Administered 2014-10-14 (×2): 20 mL

## 2014-10-14 MED ORDER — OXYCODONE HCL 5 MG PO TABS
5.0000 mg | ORAL_TABLET | Freq: Once | ORAL | Status: AC | PRN
  Administered 2014-10-14: 5 mg via ORAL

## 2014-10-14 MED ORDER — HYDROMORPHONE HCL 1 MG/ML IJ SOLN
0.2500 mg | INTRAMUSCULAR | Status: DC | PRN
Start: 2014-10-14 — End: 2014-10-14
  Administered 2014-10-14: 0.25 mg via INTRAVENOUS

## 2014-10-14 MED ORDER — FENTANYL CITRATE 0.05 MG/ML IJ SOLN
INTRAMUSCULAR | Status: AC
  Filled 2014-10-14: qty 12

## 2014-10-14 MED ORDER — ONDANSETRON HCL 4 MG/2ML IJ SOLN: 4.0000 mg | Freq: Once | INTRAMUSCULAR | Status: DC | PRN

## 2014-10-14 MED ORDER — FENTANYL CITRATE 0.05 MG/ML IJ SOLN: 50.0000 ug | INTRAMUSCULAR | Status: DC | PRN

## 2014-10-14 MED ORDER — BUPIVACAINE LIPOSOME 1.3 % IJ SUSP
INTRAMUSCULAR | Status: DC | PRN
  Administered 2014-10-14: 20 mL

## 2014-10-14 MED ORDER — SCOPOLAMINE 1 MG/3DAYS TD PT72
1.0000 | MEDICATED_PATCH | TRANSDERMAL | Status: DC
  Administered 2014-10-14: 1.5 mg via TRANSDERMAL

## 2014-10-14 MED ORDER — FENTANYL CITRATE 0.05 MG/ML IJ SOLN
INTRAMUSCULAR | Status: DC | PRN
  Administered 2014-10-14: 100 ug via INTRAVENOUS
  Administered 2014-10-14 (×2): 50 ug via INTRAVENOUS
  Administered 2014-10-14 (×2): 100 ug via INTRAVENOUS

## 2014-10-14 MED ORDER — DEXAMETHASONE SODIUM PHOSPHATE 4 MG/ML IJ SOLN
INTRAMUSCULAR | Status: DC | PRN
  Administered 2014-10-14: 10 mg via INTRAVENOUS

## 2014-10-14 SURGICAL SUPPLY — 63 items
APL SKNCLS STERI-STRIP NONHPOA (GAUZE/BANDAGES/DRESSINGS) ×1
BAG DECANTER FOR FLEXI CONT (MISCELLANEOUS) ×3 IMPLANT
BENZOIN TINCTURE PRP APPL 2/3 (GAUZE/BANDAGES/DRESSINGS) ×4 IMPLANT
BINDER BREAST XLRG (GAUZE/BANDAGES/DRESSINGS) ×2 IMPLANT
BLADE KNIFE PERSONA 10 (BLADE) ×12 IMPLANT
BLADE KNIFE PERSONA 15 (BLADE) ×9 IMPLANT
BNDG GAUZE ELAST 4 BULKY (GAUZE/BANDAGES/DRESSINGS) ×6 IMPLANT
CANISTER SUCT 1200ML W/VALVE (MISCELLANEOUS) ×3 IMPLANT
CAP BOUFFANT 24 BLUE NURSES (PROTECTIVE WEAR) ×3 IMPLANT
CLOSURE STERI-STRIP 1/2X4 (GAUZE/BANDAGES/DRESSINGS) ×1
CLOSURE WOUND 1/2 X4 (GAUZE/BANDAGES/DRESSINGS) ×4
CLSR STERI-STRIP ANTIMIC 1/2X4 (GAUZE/BANDAGES/DRESSINGS) ×1 IMPLANT
COVER BACK TABLE 60X90IN (DRAPES) ×3 IMPLANT
COVER MAYO STAND STRL (DRAPES) ×3 IMPLANT
DECANTER SPIKE VIAL GLASS SM (MISCELLANEOUS) ×6 IMPLANT
DRAIN CHANNEL 10F 3/8 F FF (DRAIN) ×6 IMPLANT
DRAPE LAPAROSCOPIC ABDOMINAL (DRAPES) ×3 IMPLANT
DRAPE U-SHAPE 76X120 STRL (DRAPES) IMPLANT
DRSG EMULSION OIL 3X3 NADH (GAUZE/BANDAGES/DRESSINGS) ×4 IMPLANT
DRSG PAD ABDOMINAL 8X10 ST (GAUZE/BANDAGES/DRESSINGS) ×4 IMPLANT
ELECT REM PT RETURN 9FT ADLT (ELECTROSURGICAL) ×3
ELECTRODE REM PT RTRN 9FT ADLT (ELECTROSURGICAL) ×1 IMPLANT
EVACUATOR SILICONE 100CC (DRAIN) ×6 IMPLANT
FILTER 7/8 IN (FILTER) ×3 IMPLANT
GAUZE SPONGE 4X4 12PLY STRL (GAUZE/BANDAGES/DRESSINGS) ×6 IMPLANT
GLOVE BIO SURGEON STRL SZ7 (GLOVE) ×3 IMPLANT
GOWN STRL REUS W/ TWL LRG LVL3 (GOWN DISPOSABLE) ×2 IMPLANT
GOWN STRL REUS W/TWL LRG LVL3 (GOWN DISPOSABLE) ×6
IV NS 250ML (IV SOLUTION) ×3
IV NS 250ML BAXH (IV SOLUTION) ×1 IMPLANT
NDL HYPO 25X1 1.5 SAFETY (NEEDLE) ×3 IMPLANT
NDL SPNL 18GX3.5 QUINCKE PK (NEEDLE) ×1 IMPLANT
NEEDLE HYPO 25X1 1.5 SAFETY (NEEDLE) ×9 IMPLANT
NEEDLE SPNL 18GX3.5 QUINCKE PK (NEEDLE) ×3 IMPLANT
NS IRRIG 1000ML POUR BTL (IV SOLUTION) ×6 IMPLANT
PACK BASIN DAY SURGERY FS (CUSTOM PROCEDURE TRAY) ×3 IMPLANT
PIN SAFETY STERILE (MISCELLANEOUS) ×3 IMPLANT
SCRUB PCMX 4 OZ (MISCELLANEOUS) ×3 IMPLANT
SLEEVE SCD COMPRESS KNEE MED (MISCELLANEOUS) ×3 IMPLANT
SPECIMEN JAR MEDIUM (MISCELLANEOUS) ×7 IMPLANT
SPECIMEN JAR X LARGE (MISCELLANEOUS) IMPLANT
SPONGE GAUZE 4X4 12PLY STER LF (GAUZE/BANDAGES/DRESSINGS) ×2 IMPLANT
SPONGE LAP 18X18 X RAY DECT (DISPOSABLE) ×9 IMPLANT
STAPLER VISISTAT 35W (STAPLE) ×6 IMPLANT
STRIP CLOSURE SKIN 1/2X4 (GAUZE/BANDAGES/DRESSINGS) ×8 IMPLANT
SUT ETHILON 3 0 PS 1 (SUTURE) ×3 IMPLANT
SUT MNCRL AB 3-0 PS2 18 (SUTURE) ×12 IMPLANT
SUT MNCRL AB 4-0 PS2 18 (SUTURE) ×6 IMPLANT
SUT MON AB 5-0 PS2 18 (SUTURE) ×6 IMPLANT
SUT PROLENE 2 0 CT2 30 (SUTURE) ×3 IMPLANT
SUT PROLENE 3 0 PS 1 (SUTURE) ×6 IMPLANT
SUT QUILL PDO 2-0 (SUTURE) ×6 IMPLANT
SYR BULB IRRIGATION 50ML (SYRINGE) ×6 IMPLANT
SYR CONTROL 10ML LL (SYRINGE) ×9 IMPLANT
TOWEL OR 17X24 6PK STRL BLUE (TOWEL DISPOSABLE) ×9 IMPLANT
TOWEL OR NON WOVEN STRL DISP B (DISPOSABLE) ×3 IMPLANT
TRAY DSU PREP LF (CUSTOM PROCEDURE TRAY) ×3 IMPLANT
TRAY FOLEY CATH 16FR SILVER (SET/KITS/TRAYS/PACK) ×3 IMPLANT
TUBE CONNECTING 20'X1/4 (TUBING) ×1
TUBE CONNECTING 20X1/4 (TUBING) ×2 IMPLANT
UNDERPAD 30X30 INCONTINENT (UNDERPADS AND DIAPERS) ×6 IMPLANT
VAC PENCILS W/TUBING CLEAR (MISCELLANEOUS) ×3 IMPLANT
YANKAUER SUCT BULB TIP NO VENT (SUCTIONS) ×3 IMPLANT

## 2014-10-14 NOTE — Discharge Instructions (Signed)
1. No lifting greater than 5 lbs with arms for 4 weeks. 2. Empty, strip, record and reactivate JP drains 3 times a day. 3. Percocet 5/325 mg tabs 1-2 tabs po q 4-6 hours prn pain- prescription given in office. 4. Duricef 1 tab po bid- prescription given in office. 5. Sterapred dose pack as directed- prescription given in office. 6. Follow-up appointment Friday in office.    Post Anesthesia Home Care Instructions  Activity: Get plenty of rest for the remainder of the day. A responsible adult should stay with you for 24 hours following the procedure.  For the next 24 hours, DO NOT: -Drive a car -Advertising copywriterperate machinery -Drink alcoholic beverages -Take any medication unless instructed by your physician -Make any legal decisions or sign important papers.  Meals: Start with liquid foods such as gelatin or soup. Progress to regular foods as tolerated. Avoid greasy, spicy, heavy foods. If nausea and/or vomiting occur, drink only clear liquids until the nausea and/or vomiting subsides. Call your physician if vomiting continues.  Special Instructions/Symptoms: Your throat may feel dry or sore from the anesthesia or the breathing tube placed in your throat during surgery. If this causes discomfort, gargle with warm salt water. The discomfort should disappear within 24 hours.  JP Drain [employer]  Bring this sheet to all of your post-operative appointments while you have your drains.  Please measure your drains by CC's or ML's.  Make sure you drain and measure your JP Drains 3 times per day.  At the end of each day, add up totals for the left side and add up totals for the right side.    ( 9 am )     ( 3 pm )        ( 9 pm )                Date L  R  L  R  L  R  Total L/R

## 2014-10-14 NOTE — Transfer of Care (Signed)
Immediate Anesthesia Transfer of Care Note  Patient: Carol Dixon  Procedure(s) Performed: Procedure(s): BILATERAL MAMMARY REDUCTION  (BREAST) (Bilateral)  Patient Location: PACU  Anesthesia Type:General  Level of Consciousness: awake, alert , oriented and patient cooperative  Airway & Oxygen Therapy: Patient Spontanous Breathing and Patient connected to face mask oxygen  Post-op Assessment: Report given to PACU RN and Post -op Vital signs reviewed and stable  Post vital signs: Reviewed and stable  Complications: No apparent anesthesia complications

## 2014-10-14 NOTE — Brief Op Note (Signed)
10/14/2014  11:44 AM  PATIENT:  Carol Dixon  38 y.o. female  PRE-OPERATIVE DIAGNOSIS:  hypertrophy of bilateral breast  POST-OPERATIVE DIAGNOSIS:  hypertrophy of bilateral breast  PROCEDURE:  Procedure(s): BILATERAL MAMMARY REDUCTION  (BREAST) (Bilateral)  SURGEON:  Surgeon(s) and Role:    * Joenathan Sakuma A Aoife Bold, MD - Primary  ANESTHESIA:   general  EBL:  Total I/O In: 2300 [I.V.:2300] Out: 675 [Urine:500; Blood:175]  BLOOD ADMINISTERED:none  DRAINS: (77F) Jackson-Pratt drain(s) with closed bulb suction in the Bilateral Breasts   LOCAL MEDICATIONS USED:  1.3% Exparel (total 266 mgs.)  SPECIMEN:  Source of Specimen:  Bilateral breasts  DISPOSITION OF SPECIMEN:  PATHOLOGY  COUNTS:  YES  DICTATION: .Other Dictation: Dictation Number [id number]  PLAN OF CARE: Discharge to home after PACU  PATIENT DISPOSITION:  PACU - hemodynamically stable.   Delay start of Pharmacological VTE agent (>24hrs) due to surgical blood loss or risk of bleeding: not applicable

## 2014-10-14 NOTE — Anesthesia Preprocedure Evaluation (Addendum)
Anesthesia Evaluation  Patient identified by MRN, date of birth, ID band Patient awake    Reviewed: Allergy & Precautions, H&P , NPO status , Patient's Chart, lab work & pertinent test results  Airway Mallampati: I  TM Distance: >3 FB Neck ROM: Full    Dental  (+) Teeth Intact, Dental Advisory Given   Pulmonary Current Smoker,  breath sounds clear to auscultation        Cardiovascular Rhythm:Regular Rate:Normal     Neuro/Psych    GI/Hepatic GERD-  Medicated and Controlled,  Endo/Other  Morbid obesity  Renal/GU      Musculoskeletal   Abdominal   Peds  Hematology   Anesthesia Other Findings   Reproductive/Obstetrics                           Anesthesia Physical Anesthesia Plan  ASA: II  Anesthesia Plan: General   Post-op Pain Management:    Induction: Intravenous  Airway Management Planned: Oral ETT  Additional Equipment:   Intra-op Plan:   Post-operative Plan: Extubation in OR  Informed Consent: I have reviewed the patients History and Physical, chart, labs and discussed the procedure including the risks, benefits and alternatives for the proposed anesthesia with the patient or authorized representative who has indicated his/her understanding and acceptance.   Dental advisory given  Plan Discussed with: CRNA, Anesthesiologist and Surgeon  Anesthesia Plan Comments:         Anesthesia Quick Evaluation

## 2014-10-14 NOTE — Anesthesia Procedure Notes (Signed)
Procedure Name: Intubation Date/Time: 10/14/2014 7:51 AM Performed by: Averlee Swartz Pre-anesthesia Checklist: Patient identified, Emergency Drugs available, Suction available and Patient being monitored Patient Re-evaluated:Patient Re-evaluated prior to inductionOxygen Delivery Method: Circle System Utilized Preoxygenation: Pre-oxygenation with 100% oxygen Intubation Type: IV induction Ventilation: Mask ventilation without difficulty Tube type: Oral Tube size: 7.0 mm Number of attempts: 2 Airway Equipment and Method: stylet,  oral airway and Video-laryngoscopy Placement Confirmation: ETT inserted through vocal cords under direct vision,  positive ETCO2 and breath sounds checked- equal and bilateral Secured at: 21 cm Tube secured with: Tape Dental Injury: Teeth and Oropharynx as per pre-operative assessment  Comments: DL x 1 with Mac 3 very limited view of VC with small oral pharynx, unable to direct tube through VC, glide scope utilized VC viewed 7.0 ETT placed atraumatic, =BBS, + ETCO2, teeth unchanged. Pt anterior with small mouth

## 2014-10-14 NOTE — H&P (Signed)
  H&P faxed to surgical center.  -History and Physical Reviewed  -Patient has been re-examined  -No change in the plan of care  Carol Dixon    

## 2014-10-14 NOTE — Anesthesia Postprocedure Evaluation (Signed)
  Anesthesia Post-op Note  Patient: Carol Dixon  Procedure(s) Performed: Procedure(s): BILATERAL MAMMARY REDUCTION  (BREAST) (Bilateral)  Patient Location: PACU  Anesthesia Type: General   Level of Consciousness: awake, alert  and oriented  Airway and Oxygen Therapy: Patient Spontanous Breathing  Post-op Pain: mild  Post-op Assessment: Post-op Vital signs reviewed  Post-op Vital Signs: Reviewed  Last Vitals:  Filed Vitals:   10/14/14 1328  BP: 98/76  Pulse: 87  Temp: 36.6 C  Resp: 16    Complications: No apparent anesthesia complications

## 2014-10-15 ENCOUNTER — Encounter (HOSPITAL_BASED_OUTPATIENT_CLINIC_OR_DEPARTMENT_OTHER): Payer: Self-pay | Admitting: Plastic Surgery

## 2014-10-15 NOTE — Op Note (Signed)
NAMMarlyne Dixon:  Almquist, Vira                 ACCOUNT NO.:  [id number]  MEDICAL RECORD NO.:  [id number]  LOCATION:                                 FACILITY:  PHYSICIAN:  Brantley PersonsMary Contogiannis, M.D.DATE OF BIRTH:  1976-09-17  DATE OF PROCEDURE:  10/14/2014 DATE OF DISCHARGE:                              OPERATIVE REPORT   PREOPERATIVE DIAGNOSIS:  Bilateral macromastia.  POSTOPERATIVE DIAGNOSIS:  Bilateral macromastia.  PROCEDURE:  Bilateral reduction mammoplasty.  ATTENDING SURGEON:  Brantley PersonsMary Contogiannis, M.D.  ANESTHESIA:  General endotracheal.  COMPLICATIONS:  None.  FLUID REPLACEMENT:  3000 mL crystalloid.  URINE OUTPUT:  500 mL.  ESTIMATED BLOOD LOSS:  175 mL.  INDICATIONS FOR THE PROCEDURE:  The patient is a 38 year old Caucasian female who has bilateral macromastia that is clinically symptomatic. She presents to undergo bilateral reduction mammoplasties.  DESCRIPTION OF PROCEDURE:  The patient was marked in the preop holding area in the pattern of Wise for the future bilateral reduction mammoplasties.  She was then taken back to the OR and placed on the table in supine position.  After adequate general anesthesia was obtained, the patient's chest was prepped with Techni-Care and draped in sterile fashion.  The bases of the breasts had been infiltrated with 1% lidocaine with epinephrine.  After adequate hemostasis and anesthesia taken effect, the procedure was begun.  Both of the breast reductions were performed in the following similar manner.  The nipple-areolar complex was marked with a 45-mm nipple marker.  The skin was then incised and deepithelialized around the nipple-areolar complex down to the inframammary crease in the inferior pedicle pattern.  Next the medial, superior, and lateral skin flaps were elevated down to the chest wall.  The excess fat and glandular tissue removed from the inferior pedicle.  The wound was irrigated with saline irrigation.  Meticulous  hemostasis was obtained with the Bovie electrocautery.  The nipple-areolar complex was examined and found to be pink and viable.  The inferior pedicle was then centralized using 3-0 Prolene suture.  A #10 JP flat fully-fluted drain was placed into the wound.  The skin flaps brought together at the inverted T junction with a 2-0 Prolene suture.  The incisions were stapled for temporary closure. The breasts were compared and found to have good shape and symmetry.  The incisions were then closed from the medial aspect of JP drain to the medial aspect of the [facility] incision by first placing a few 3-0 Monocryl sutures to tack together the dermal layer, and then both the dermal and cuticular layers were closed in a single layer using a 2-0 Quill PDO barbed suture.  Lateral to the JP drain, the incision was closed using 3-0 Monocryl in dermal layer followed by 3-0 Monocryl running intracuticular stitch on skin.  The vertical limb of the Wise pattern was then closed using 3-0 Monocryl in the dermal layer.  The patient was placed in the upright position.  The future location of the nipple-areolar complexes were marked on both breast mounds using the 45-mm nipple marker.  She was then placed back in the recumbent position.  Both of the nipple-areolar complexes were brought out onto the breast mounds in  the following similar manner.  The skin was incised as marked and removed full thickness into the subcutaneous tissues.  The nipple- areolar complex was examined, found to be pink and viable, then brought out through this aperture and sewn in place using 4-0 Monocryl in the dermal layer, followed by 5-0 Monocryl running intracuticular stitch on the skin.  This 5-0 Monocryl suture was then brought down to close the cuticular layer of the vertical limb as well.  The drains were sewn in place using 3-0 nylon suture.  Prior to closure of the breast wounds, the pectoralis major muscle and fascia along with  the breast and chest wall soft tissue were infiltrated using 1.3% Exparel (total 266 mg) to provide postoperative pain control.  Some of the Exparel was used to also infiltrate the inframammary crease incision after closure to provide anesthesia there.  Incisions were then dressed with benzoin and Steri-Strips.  The nipples were additionally dressed with bacitracin ointment and Adaptic.  4x4s were placed over the incisions and ABD pads in the axillary areas.  She was then placed into a light postoperative support bra.  There were no complications.  The patient tolerated the procedure well.  The final needle and sponge counts were reported to be correct at the end of the case.  The patient was then extubated and taken to recovery room in stable condition.  She was also recovered without complications.  Both the patient and her husband were given proper postoperative wound care instructions including care of the JP drains.  She was then discharged home in the care of her husband in stable condition.  Followup appointment will be on Thursday in the office.          ______________________________ Brantley PersonsMary Contogiannis, M.D.     MC/MEDQ  D:  10/14/2014  T:  10/15/2014  Job:  [id number]

## 2014-10-16 ENCOUNTER — Encounter (HOSPITAL_BASED_OUTPATIENT_CLINIC_OR_DEPARTMENT_OTHER): Admission: RE | Payer: Self-pay | Source: Ambulatory Visit

## 2014-10-16 ENCOUNTER — Ambulatory Visit (HOSPITAL_BASED_OUTPATIENT_CLINIC_OR_DEPARTMENT_OTHER): Admission: RE | Admit: 2014-10-16 | Payer: BC Managed Care – PPO | Source: Ambulatory Visit | Admitting: Plastic Surgery

## 2014-10-16 SURGERY — MAMMOPLASTY, REDUCTION
Anesthesia: General | Laterality: Bilateral

## 2015-06-04 ENCOUNTER — Emergency Department (HOSPITAL_COMMUNITY)
Admission: EM | Admit: 2015-06-04 | Discharge: 2015-06-04 | Discharge status: Against Medical Advice | Payer: BLUE CROSS/BLUE SHIELD | Attending: Emergency Medicine | Admitting: Emergency Medicine

## 2015-06-04 ENCOUNTER — Encounter (HOSPITAL_COMMUNITY): Payer: Self-pay | Admitting: *Deleted

## 2015-06-04 DIAGNOSIS — Z720 Tobacco use: Secondary | ICD-10-CM | POA: Insufficient documentation

## 2015-06-04 DIAGNOSIS — R079 Chest pain, unspecified: Secondary | ICD-10-CM | POA: Diagnosis present

## 2015-06-04 DIAGNOSIS — R200 Anesthesia of skin: Secondary | ICD-10-CM | POA: Insufficient documentation

## 2015-06-04 DIAGNOSIS — I10 Essential (primary) hypertension: Secondary | ICD-10-CM | POA: Diagnosis not present

## 2015-06-04 DIAGNOSIS — R0789 Other chest pain: Secondary | ICD-10-CM | POA: Insufficient documentation

## 2015-06-04 DIAGNOSIS — R202 Paresthesia of skin: Secondary | ICD-10-CM | POA: Diagnosis not present

## 2015-06-04 HISTORY — DX: Essential (primary) hypertension: I10

## 2015-06-04 LAB — I-STAT TROPONIN, ED: TROPONIN I, POC: 0 ng/mL (ref 0.00–0.08)

## 2015-06-04 LAB — BASIC METABOLIC PANEL
Anion gap: 10 (ref 5–15)
BUN: 15 mg/dL (ref 6–20)
CALCIUM: 9.5 mg/dL (ref 8.9–10.3)
CO2: 24 mmol/L (ref 22–32)
CREATININE: 0.71 mg/dL (ref 0.44–1.00)
Chloride: 106 mmol/L (ref 101–111)
GFR calc non Af Amer: >60 mL/min (ref >60)
GLUCOSE: 104 mg/dL — AB (ref 65–99)
Potassium: 4 mmol/L (ref 3.5–5.1)
Sodium: 140 mmol/L (ref 135–145)

## 2015-06-04 LAB — CBC
HCT: 44.4 % (ref 36.0–46.0)
Hemoglobin: 15.5 g/dL — ABNORMAL HIGH (ref 12.0–15.0)
MCH: 31.3 pg (ref 26.0–34.0)
MCHC: 34.9 g/dL (ref 30.0–36.0)
MCV: 89.5 fL (ref 78.0–100.0)
PLATELETS: 288 10*3/uL (ref 150–400)
RBC: 4.96 MIL/uL (ref 3.87–5.11)
RDW: 14.3 % (ref 11.5–15.5)
WBC: 10.1 10*3/uL (ref 4.0–10.5)

## 2015-06-04 MED ORDER — ALBUTEROL SULFATE (2.5 MG/3ML) 0.083% IN NEBU
5.0000 mg | INHALATION_SOLUTION | Freq: Once | RESPIRATORY_TRACT | Status: DC
  Filled 2015-06-04: qty 6

## 2015-06-04 NOTE — ED Notes (Signed)
Phlebotomy at the bedside  

## 2015-06-04 NOTE — Discharge Instructions (Signed)

## 2015-06-04 NOTE — ED Notes (Signed)
Patient declines breathing treatment and IV at this time. Reports no shortness of breath. Awaiting lab results.

## 2015-06-04 NOTE — ED Provider Notes (Signed)
Patient refusing to stay for further work up for chest pain after being informed of negative troponin. Signed out AMA prior to my seeing the patient. Was informed by nursing staff of risks of leaving prior to complete work up being done or being seen by full provider team.   Francee Piccolo, PA-C 06/04/15 [id number]  Derwood Kaplan, MD 06/04/15 1005

## 2015-06-04 NOTE — ED Notes (Signed)
Explained risks of leaving, patient state she may go to urgent care later, but she has to go to work now. AMA signed.

## 2015-06-04 NOTE — ED Notes (Signed)
Patient declined pain medication after explaining nitro.

## 2015-06-04 NOTE — ED Notes (Signed)
Reported to [place], [place], that patient is not interested in breathing treatment, declined IV access, nitro tabs, and has refused xray. She acknowledges.

## 2015-06-04 NOTE — ED Notes (Signed)
The pt is c/o central chest pain since this am while driving to work  C/o both arms tingling and numb  lmp iud.

## 2015-06-04 NOTE — ED Notes (Signed)
Xray now present at the bedside, patient declines transport.

## 2017-10-12 DIAGNOSIS — E669 Obesity, unspecified: Secondary | ICD-10-CM | POA: Insufficient documentation

## 2017-10-12 DIAGNOSIS — R7303 Prediabetes: Secondary | ICD-10-CM | POA: Insufficient documentation

## 2017-10-12 DIAGNOSIS — E786 Lipoprotein deficiency: Secondary | ICD-10-CM | POA: Insufficient documentation

## 2017-10-12 DIAGNOSIS — E66811 Obesity, class 1: Secondary | ICD-10-CM | POA: Insufficient documentation

## 2018-07-05 DIAGNOSIS — Z1329 Encounter for screening for other suspected endocrine disorder: Secondary | ICD-10-CM | POA: Insufficient documentation

## 2018-08-14 DIAGNOSIS — Z720 Tobacco use: Secondary | ICD-10-CM | POA: Insufficient documentation

## 2020-01-03 ENCOUNTER — Ambulatory Visit: Payer: 59 | Attending: Internal Medicine

## 2020-01-03 DIAGNOSIS — Z23 Encounter for immunization: Secondary | ICD-10-CM

## 2020-01-03 NOTE — Progress Notes (Signed)
   Covid-19 Vaccination Clinic  Name:  Carol Dixon    MRN: [id number] DOB: November 13, 1975  01/03/2020  Ms. Groll was observed post Covid-19 immunization for 15 minutes without incident. She was provided with Vaccine Information Sheet and instruction to access the V-Safe system.   Ms. Brazee was instructed to call 911 with any severe reactions post vaccine: Marland Kitchen Difficulty breathing  . Swelling of face and throat  . A fast heartbeat  . A bad rash all over body  . Dizziness and weakness   Immunizations Administered    Name Date Dose VIS Date Route   Moderna COVID-19 Vaccine 01/03/2020 10:11 AM 0.5 mL 09/17/2019 Intramuscular   Manufacturer: Moderna   Lot: [id number]   NDC: 22482-500-37

## 2020-02-05 ENCOUNTER — Ambulatory Visit: Payer: 59 | Attending: Internal Medicine

## 2020-02-05 DIAGNOSIS — Z23 Encounter for immunization: Secondary | ICD-10-CM

## 2020-02-05 NOTE — Progress Notes (Signed)
   Covid-19 Vaccination Clinic  Name:  Carol Dixon    MRN: [id number] DOB: 10-31-1975  02/05/2020  Carol Dixon was observed post Covid-19 immunization for 15 minutes without incident. She was provided with Vaccine Information Sheet and instruction to access the V-Safe system.   Carol Dixon was instructed to call 911 with any severe reactions post vaccine: Marland Kitchen Difficulty breathing  . Swelling of face and throat  . A fast heartbeat  . A bad rash all over body  . Dizziness and weakness   Immunizations Administered    Name Date Dose VIS Date Route   Moderna COVID-19 Vaccine 02/05/2020  3:48 PM 0.5 mL 09/2019 Intramuscular   Manufacturer: Moderna   Lot: [id number]   NDC: 81661-969-40

## 2021-03-24 DIAGNOSIS — L237 Allergic contact dermatitis due to plants, except food: Secondary | ICD-10-CM | POA: Insufficient documentation

## 2021-11-05 ENCOUNTER — Other Ambulatory Visit: Payer: Self-pay

## 2021-11-05 ENCOUNTER — Other Ambulatory Visit: Payer: Self-pay | Admitting: Nurse Practitioner

## 2021-11-05 ENCOUNTER — Ambulatory Visit (INDEPENDENT_AMBULATORY_CARE_PROVIDER_SITE_OTHER): Payer: 59 | Admitting: Nurse Practitioner

## 2021-11-05 ENCOUNTER — Encounter: Payer: Self-pay | Admitting: Nurse Practitioner

## 2021-11-05 VITALS — BP 128/76 | HR 96 | Temp 98.4°F | Ht <= 58 in | Wt 192.0 lb

## 2021-11-05 DIAGNOSIS — Z1329 Encounter for screening for other suspected endocrine disorder: Secondary | ICD-10-CM | POA: Diagnosis not present

## 2021-11-05 DIAGNOSIS — R079 Chest pain, unspecified: Secondary | ICD-10-CM

## 2021-11-05 DIAGNOSIS — Z1231 Encounter for screening mammogram for malignant neoplasm of breast: Secondary | ICD-10-CM

## 2021-11-05 DIAGNOSIS — Z6841 Body Mass Index (BMI) 40.0 and over, adult: Secondary | ICD-10-CM

## 2021-11-05 DIAGNOSIS — R5383 Other fatigue: Secondary | ICD-10-CM

## 2021-11-05 DIAGNOSIS — Z131 Encounter for screening for diabetes mellitus: Secondary | ICD-10-CM

## 2021-11-05 DIAGNOSIS — Z124 Encounter for screening for malignant neoplasm of cervix: Secondary | ICD-10-CM

## 2021-11-05 DIAGNOSIS — Z7184 Encounter for health counseling related to travel: Secondary | ICD-10-CM

## 2021-11-05 DIAGNOSIS — L709 Acne, unspecified: Secondary | ICD-10-CM

## 2021-11-05 DIAGNOSIS — F419 Anxiety disorder, unspecified: Secondary | ICD-10-CM

## 2021-11-05 DIAGNOSIS — L680 Hirsutism: Secondary | ICD-10-CM

## 2021-11-05 LAB — LIPID PANEL
Cholesterol: 184 mg/dL (ref 0–200)
HDL: 46.7 mg/dL (ref >39.00)
LDL Cholesterol: 114 mg/dL — ABNORMAL HIGH (ref 0–99)
NonHDL: 137.39
Total CHOL/HDL Ratio: 4
Triglycerides: 115 mg/dL (ref 0.0–149.0)
VLDL: 23 mg/dL (ref 0.0–40.0)

## 2021-11-05 LAB — COMPREHENSIVE METABOLIC PANEL
ALT: 19 U/L (ref 0–35)
AST: 17 U/L (ref 0–37)
Albumin: 4.7 g/dL (ref 3.5–5.2)
Alkaline Phosphatase: 89 U/L (ref 39–117)
BUN: 13 mg/dL (ref 6–23)
CO2: 28 mEq/L (ref 19–32)
Calcium: 9.7 mg/dL (ref 8.4–10.5)
Chloride: 103 mEq/L (ref 96–112)
Creatinine, Ser: 0.81 mg/dL (ref 0.40–1.20)
GFR: 88.05 mL/min (ref >60.00)
Glucose, Bld: 76 mg/dL (ref 70–99)
Potassium: 4.2 mEq/L (ref 3.5–5.1)
Sodium: 139 mEq/L (ref 135–145)
Total Bilirubin: 0.4 mg/dL (ref 0.2–1.2)
Total Protein: 7.7 g/dL (ref 6.0–8.3)

## 2021-11-05 LAB — CBC WITH DIFFERENTIAL/PLATELET
Basophils Absolute: 0.1 10*3/uL (ref 0.0–0.1)
Basophils Relative: 0.7 % (ref 0.0–3.0)
Eosinophils Absolute: 0.2 10*3/uL (ref 0.0–0.7)
Eosinophils Relative: 2.7 % (ref 0.0–5.0)
HCT: 44.9 % (ref 36.0–46.0)
Hemoglobin: 14.7 g/dL (ref 12.0–15.0)
Lymphocytes Relative: 36.6 % (ref 12.0–46.0)
Lymphs Abs: 3 10*3/uL (ref 0.7–4.0)
MCHC: 32.8 g/dL (ref 30.0–36.0)
MCV: 87.8 fl (ref 78.0–100.0)
Monocytes Absolute: 0.5 10*3/uL (ref 0.1–1.0)
Monocytes Relative: 6.8 % (ref 3.0–12.0)
Neutro Abs: 4.3 10*3/uL (ref 1.4–7.7)
Neutrophils Relative %: 53.2 % (ref 43.0–77.0)
Platelets: 308 10*3/uL (ref 150.0–400.0)
RBC: 5.11 Mil/uL (ref 3.87–5.11)
RDW: 13.7 % (ref 11.5–15.5)
WBC: 8.1 10*3/uL (ref 4.0–10.5)

## 2021-11-05 LAB — T3, FREE: T3, Free: 4 pg/mL (ref 2.3–4.2)

## 2021-11-05 LAB — TSH: TSH: 2.04 u[IU]/mL (ref 0.35–5.50)

## 2021-11-05 LAB — LUTEINIZING HORMONE: LH: 58.01 m[IU]/mL

## 2021-11-05 LAB — FOLLICLE STIMULATING HORMONE: FSH: 108.4 m[IU]/mL

## 2021-11-05 LAB — T4, FREE: Free T4: 0.64 ng/dL (ref 0.60–1.60)

## 2021-11-05 LAB — HEMOGLOBIN A1C: Hgb A1c MFr Bld: 6 % (ref 4.6–6.5)

## 2021-11-05 MED ORDER — HYDROXYZINE HCL 25 MG PO TABS: 25.0000 mg | ORAL_TABLET | Freq: Two times a day (BID) | ORAL | 1 refills | Status: DC | PRN

## 2021-11-05 NOTE — Progress Notes (Signed)
Subjective:  Patient ID: Carol Dixon, female    DOB: Oct 23, 1975  Age: 46 y.o. MRN: [id number]  CC:  Chief Complaint  Patient presents with   Transitions Of Care      HPI  This patient arrives today for the above.  She arrives as a new patient to this office.  She tells me she is not been having routine medical care for herself for a long time.  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  Her main motivation for coming and being established with primary care provider is for evaluation of her fatigue, weight gain, shortness of breath, hirsutism, heartburn, and acne.  She also mentions that she is taking a work trip to [place] in the near future.  She is wondering about vaccines for traveling there.  She has been there in the past and took it MMR vaccine back in 2019 as well as a typhoid vaccine in 2019.  She is not sure if she needs a hepatitis B vaccine.  She also got hepatitis A vaccine in 2019.  She mentioned that she is due for Pap smear and mammogram.  She does have IUD in place and believes it is due for removal.  Past Medical History:  Diagnosis Date   Breast hypertrophy 09/2014   Dental crown present 09/2014   GERD (gastroesophageal reflux disease)    History of gastric ulcer    Hypertension       Family History  Problem Relation Age of Onset   Hypertension Mother    Hypertension Father    Hyperlipidemia Father    Stroke Maternal Aunt    Stroke Maternal Uncle    Diabetes Paternal Aunt    Hyperlipidemia Paternal Aunt    Cancer Paternal Uncle    COPD Maternal Grandmother    Cancer Paternal Grandmother    Cancer Paternal Grandfather     Social History   Social History Narrative   Not on file   Social History   Tobacco Use   Smoking status: Every Day    Packs/day: 0.50    Years: 20.00    Pack years: 10.00    Types: Cigarettes   Smokeless tobacco: Never  Substance Use Topics   Alcohol use: No     No outpatient medications have been  marked as taking for the 11/05/21 encounter (Office Visit) with Ailene Ards, NP.    ROS:  Review of Systems  Constitutional:  Positive for malaise/fatigue.  Respiratory:  Positive for shortness of breath.   Cardiovascular:  Negative for chest pain.  Gastrointestinal:  Positive for heartburn.  Skin:        (+) Hirsutism, (+) acne    Objective:   Today's Vitals: BP 128/76 (BP Location: Left Arm, Patient Position: Sitting, Cuff Size: Large)    Pulse 96    Temp 98.4 F (36.9 C) (Oral)    Ht 4' 10"  (1.473 m)    Wt 192 lb (87.1 kg)    SpO2 97%    BMI 40.13 kg/m  Vitals with BMI 11/05/2021 06/04/2015 10/14/2014  Height 4' 10"  - -  Weight 192 lbs - -  BMI 65.03 - -  Systolic 546 568 98  Diastolic 76 79 76  Pulse 96 90 87     Physical Exam Vitals reviewed.  Constitutional:      General: She is not in acute distress.    Appearance: Normal appearance.  HENT:     Head: Normocephalic  and atraumatic.  Neck:     Vascular: No carotid bruit.  Cardiovascular:     Rate and Rhythm: Normal rate and regular rhythm.     Pulses: Normal pulses.     Heart sounds: Normal heart sounds.  Pulmonary:     Effort: Pulmonary effort is normal.     Breath sounds: Normal breath sounds.  Skin:    General: Skin is warm and dry.  Neurological:     General: No focal deficit present.     Mental Status: She is alert and oriented to person, place, and time.  Psychiatric:        Mood and Affect: Mood normal.        Behavior: Behavior normal.        Judgment: Judgment normal.         Assessment and Plan   1. Fatigue, unspecified type   2. Thyroid disorder screening   3. Diabetes mellitus screening   4. Class 3 severe obesity without serious comorbidity with body mass index (BMI) of 40.0 to 44.9 in adult, unspecified obesity type (HCC)   5. Cervical cancer screening   6. Encounter for screening mammogram for malignant neoplasm of breast   7. Hirsutism   8. Acne, unspecified acne type   9.  Counseling about travel   10. Intermittent chest pain      Plan:  1.-4.,  7.-8.  We will collect blood work for further evaluation today.  We will also send for pelvic ultrasound to look for signs of polycystic ovarian syndrome.  We will refer patient to OB/GYN for additional assistance with managing her concerns and possibly diagnosing with PCOS. 5.  Referral to OB/GYN for Pap smear and IUD removal made today. 6.  Ordered mammogram for breast cancer screening. 9.  We will check blood work to look for MMR and hepatitis B immunity.  Per CDC website typhoid vaccine booster should be done every 2 years if needed.  She would be due for booster before traveling I recommended she consider getting typhoid vaccine readministered.  She can discuss this with her pharmacy. 10.  She did mention intermittent chest pain, she does have a family history of heart disease and risk factors so we will refer her to cardiology for work-up.  Tests ordered Orders Placed This Encounter  Procedures   MM Digital Screening   US Pelvis Complete   TSH   Hemoglobin A1c   Lipid panel   Comprehensive metabolic panel   CBC with Differential/Platelet   T4, free   T3, free   LH   FSH   Measles/Mumps/Rubella Immunity   Hepatitis B surface antibody,quantitative   Hepatitis B Core Antibody, total   Ambulatory referral to Obstetrics / Gynecology   Ambulatory referral to Cardiology      No orders of the defined types were placed in this encounter.   Patient to follow-up in 1 month or sooner as needed.  Ailene Ards, NP

## 2021-11-08 LAB — HEPATITIS B SURFACE ANTIBODY, QUANTITATIVE: Hepatitis B-Post: <5 m[IU]/mL — ABNORMAL LOW (ref >=10)

## 2021-11-08 LAB — MEASLES/MUMPS/RUBELLA IMMUNITY
Mumps IgG: 100 AU/mL
Rubella: 3.2 Index
Rubeola IgG: 26.8 AU/mL

## 2021-11-08 LAB — HEPATITIS B CORE ANTIBODY, TOTAL: Hep B Core Total Ab: NONREACTIVE

## 2021-11-09 ENCOUNTER — Encounter: Payer: Self-pay | Admitting: Nurse Practitioner

## 2021-11-25 ENCOUNTER — Encounter (HOSPITAL_COMMUNITY): Payer: Self-pay

## 2021-11-25 ENCOUNTER — Ambulatory Visit (HOSPITAL_COMMUNITY)
Admission: RE | Admit: 2021-11-25 | Discharge: 2021-11-25 | Disposition: A | Discharge status: Home / Self Care | Payer: 59 | Source: Ambulatory Visit | Attending: Nurse Practitioner | Admitting: Nurse Practitioner

## 2021-11-25 ENCOUNTER — Other Ambulatory Visit: Payer: Self-pay

## 2021-11-25 DIAGNOSIS — Z1329 Encounter for screening for other suspected endocrine disorder: Secondary | ICD-10-CM

## 2021-11-25 DIAGNOSIS — Z6841 Body Mass Index (BMI) 40.0 and over, adult: Secondary | ICD-10-CM | POA: Insufficient documentation

## 2021-11-25 DIAGNOSIS — Z7184 Encounter for health counseling related to travel: Secondary | ICD-10-CM | POA: Diagnosis present

## 2021-11-25 DIAGNOSIS — Z124 Encounter for screening for malignant neoplasm of cervix: Secondary | ICD-10-CM

## 2021-11-25 DIAGNOSIS — Z131 Encounter for screening for diabetes mellitus: Secondary | ICD-10-CM

## 2021-11-25 DIAGNOSIS — L680 Hirsutism: Secondary | ICD-10-CM

## 2021-11-25 DIAGNOSIS — E66813 Obesity, class 3: Secondary | ICD-10-CM

## 2021-11-25 DIAGNOSIS — L709 Acne, unspecified: Secondary | ICD-10-CM | POA: Diagnosis present

## 2021-11-25 DIAGNOSIS — Z1231 Encounter for screening mammogram for malignant neoplasm of breast: Secondary | ICD-10-CM

## 2021-11-25 DIAGNOSIS — R5383 Other fatigue: Secondary | ICD-10-CM

## 2021-12-01 ENCOUNTER — Telehealth: Payer: Self-pay | Admitting: Nurse Practitioner

## 2021-12-01 NOTE — Telephone Encounter (Signed)
Carol Dixon called to update status of referral. Pt appointment has been scheduled for 4.4.23 at 3:00pm.

## 2021-12-10 ENCOUNTER — Encounter: Payer: Self-pay | Admitting: Nurse Practitioner

## 2021-12-10 ENCOUNTER — Other Ambulatory Visit: Payer: Self-pay

## 2021-12-10 ENCOUNTER — Ambulatory Visit (INDEPENDENT_AMBULATORY_CARE_PROVIDER_SITE_OTHER): Payer: 59 | Admitting: Nurse Practitioner

## 2021-12-10 VITALS — BP 122/88 | HR 84 | Temp 97.9°F | Ht <= 58 in | Wt 193.0 lb

## 2021-12-10 DIAGNOSIS — R079 Chest pain, unspecified: Secondary | ICD-10-CM

## 2021-12-10 DIAGNOSIS — Z7184 Encounter for health counseling related to travel: Secondary | ICD-10-CM | POA: Diagnosis not present

## 2021-12-10 DIAGNOSIS — E66813 Obesity, class 3: Secondary | ICD-10-CM

## 2021-12-10 DIAGNOSIS — E785 Hyperlipidemia, unspecified: Secondary | ICD-10-CM | POA: Diagnosis not present

## 2021-12-10 DIAGNOSIS — R7303 Prediabetes: Secondary | ICD-10-CM

## 2021-12-10 DIAGNOSIS — Z6841 Body Mass Index (BMI) 40.0 and over, adult: Secondary | ICD-10-CM

## 2021-12-10 NOTE — Patient Instructions (Addendum)
Japanese encephalitis vaccine - call the health department to discuss further.  The Obesity Code - Dr. Wylene Simmer

## 2021-12-10 NOTE — Progress Notes (Signed)
Subjective:  Patient ID: Carol Dixon, female    DOB: May 28, 1976  Age: 46 y.o. MRN: [id number]  CC:  Chief Complaint  Patient presents with   Obesity   Prediabetes   Hyperlipidemia   Chest Pain      HPI  This patient arrives today for the above.  Obesity: She has been making lifestyle changes and recently joined the gym.  She is trying to work out on a regular basis to help with weight loss.  She already monitors her diet and tries to minimize carbohydrate intake.  She has had success with weight loss in the past by following a severely calorically restricted diet by eating 1000 cal a day, and avoiding carbohydrates.  Unfortunately this weight loss has not been sustained.  She does have comorbidities including hyperlipidemia as discussed further below.  Prediabetes: A1c collected at last office visit is 6.0.  Hyperlipidemia: Last office visit lipid panel was completed in a fasting state and LDL was elevated at 114.  She is not currently on any medication for treatment of hyperlipidemia.  Chest pain: At last office visit she mention intermittent chest pain that still seems to be occurring.  She does report elevated heart rate when she exercises and this is very concerning to her.  She tells me that he machine at the gym is showing her heart rate at 177 and she feels this is too high.   Past Medical History:  Diagnosis Date   Breast hypertrophy 09/2014   Dental crown present 09/2014   GERD (gastroesophageal reflux disease)    History of gastric ulcer    Hypertension       Family History  Problem Relation Age of Onset   Hypertension Mother    Hypertension Father    Hyperlipidemia Father    Stroke Maternal Aunt    Stroke Maternal Uncle    Diabetes Paternal Aunt    Hyperlipidemia Paternal Aunt    Cancer Paternal Uncle    COPD Maternal Grandmother    Cancer Paternal Grandmother    Cancer Paternal Grandfather     Social History   Social History Narrative   Not  on file   Social History   Tobacco Use   Smoking status: Every Day    Packs/day: 0.50    Years: 20.00    Pack years: 10.00    Types: Cigarettes   Smokeless tobacco: Never  Substance Use Topics   Alcohol use: No     Current Meds  Medication Sig   hydrOXYzine (ATARAX) 25 MG tablet Take 1 tablet (25 mg total) by mouth 2 (two) times daily as needed for anxiety.    ROS:  Review of Systems  Respiratory:  Negative for shortness of breath.   Cardiovascular:  Positive for chest pain and palpitations (with exertion).    Objective:   Today's Vitals: BP 122/88 (BP Location: Left Arm, Patient Position: Sitting, Cuff Size: Large)    Pulse 84    Temp 97.9 F (36.6 C) (Oral)    Ht 4\' 10"  (1.473 m)    Wt 193 lb (87.5 kg)    SpO2 97%    BMI 40.34 kg/m  Vitals with BMI 12/10/2021 11/05/2021 06/04/2015  Height 4\' 10"  4\' 10"  -  Weight 193 lbs 192 lbs -  BMI 40.35 40.14 -  Systolic 122 128 06/06/2015  Diastolic 88 76 79  Pulse 84 96 90     Physical Exam Vitals reviewed.  Constitutional:  General: She is not in acute distress.    Appearance: Normal appearance.  HENT:     Head: Normocephalic and atraumatic.  Neck:     Vascular: No carotid bruit.  Cardiovascular:     Rate and Rhythm: Normal rate and regular rhythm.     Pulses: Normal pulses.     Heart sounds: Normal heart sounds.  Pulmonary:     Effort: Pulmonary effort is normal.     Breath sounds: Normal breath sounds.  Skin:    General: Skin is warm and dry.  Neurological:     General: No focal deficit present.     Mental Status: She is alert and oriented to person, place, and time.  Psychiatric:        Mood and Affect: Mood normal.        Behavior: Behavior normal.        Judgment: Judgment normal.         Assessment and Plan   1. Class 3 severe obesity without serious comorbidity with body mass index (BMI) of 40.0 to 44.9 in adult, unspecified obesity type (HCC)   2. Hyperlipidemia, unspecified hyperlipidemia type    3. Intermittent chest pain   4. Counseling about travel   5. Prediabetes      Plan: 1.  We had a fairly lengthy discussion regarding treatment options.  Per shared decision making she will start with lifestyle changes and focus on her diet and increasing physical activity for now.  We may consider pharmacological medication within the next month or so if she is unable to lose weight.  I have also encouraged her to consider reading the obesity code by Dr. Wylene Simmer which talks about intermittent fasting as an option for treatment of obesity.  She tells me she will consider this.  In the event we decide to try GLP-1 agonst I did ask if she has any personal or family history of thyroid cancer she denies this.  She also denies any personal history of pancreatitis. 2.  We will monitor lipid panel over time hopefully with lifestyle changes as discussed above her lipid panel will improve as well. 3.  She has been referred to cardiology and she does have an appointment coming up shortly.  She was encouraged to keep this appointment as scheduled. 4. She is getting ready to travel to [place].  We did review the CDC immunization recommendations together for [place].  Japanese encephalitis was listed as something she could consider, she is currently undergoing hepatitis B vaccines through her pharmacy.  She was encouraged to reach out to health department for further recommendations regarding immunizations specifically Japanese encephalitis vaccine.  She is up-to-date with typhoid vaccine.  Blood work at last office visit showed that she is immune to measles mumps and rubeola. 5.  We discussed her A1c and hopefully by making the lifestyle changes that she is going to attempt as discussed above A1c will be improved as well.  We will continue to monitor periodically.  Tests ordered No orders of the defined types were placed in this encounter.     No orders of the defined types were placed in this  encounter.   Patient to follow-up in 6 months or sooner as needed.  Elenore Paddy, NP

## 2022-01-13 ENCOUNTER — Encounter: Payer: Self-pay | Admitting: Cardiology

## 2022-01-13 ENCOUNTER — Telehealth: Payer: Self-pay | Admitting: Cardiology

## 2022-01-13 ENCOUNTER — Encounter: Payer: Self-pay | Admitting: *Deleted

## 2022-01-13 ENCOUNTER — Ambulatory Visit (INDEPENDENT_AMBULATORY_CARE_PROVIDER_SITE_OTHER): Payer: 59 | Admitting: Cardiology

## 2022-01-13 VITALS — BP 138/88 | HR 84 | Ht 59.0 in | Wt 194.2 lb

## 2022-01-13 DIAGNOSIS — R0789 Other chest pain: Secondary | ICD-10-CM

## 2022-01-13 DIAGNOSIS — R0609 Other forms of dyspnea: Secondary | ICD-10-CM | POA: Diagnosis not present

## 2022-01-13 DIAGNOSIS — Z8249 Family history of ischemic heart disease and other diseases of the circulatory system: Secondary | ICD-10-CM

## 2022-01-13 NOTE — Patient Instructions (Addendum)
Medication Instructions:  ?Your physician recommends that you continue on your current medications as directed. Please refer to the Current Medication list given to you today. ? ?Labwork: ?none ? ?Testing/Procedures: ?Your physician has requested that you have an exercise tolerance test. For further information please visit [email or web address] Please also follow instruction sheet, as given. ?Your physician has requested that you have an echocardiogram. Echocardiography is a painless test that uses sound waves to create images of your heart. It provides your doctor with information about the size and shape of your heart and how well your heart?s chambers and valves are working. This procedure takes approximately one hour. There are no restrictions for this procedure. ? ?Follow-Up: ?Your physician recommends that you schedule a follow-up appointment in: pending ? ?Any Other Special Instructions Will Be Listed Below (If Applicable). ? ?If you need a refill on your cardiac medications before your next appointment, please call your pharmacy. ?

## 2022-01-13 NOTE — Telephone Encounter (Signed)
Checking percert on the following patient for testing scheduled at [facility].   ? ? ?GXT   01/21/2022 ? ? ?ECHO 4/27 at [facility] office  ?

## 2022-01-13 NOTE — Progress Notes (Signed)
? ? ?Cardiology Office Note ? ?Date: 01/13/2022  ? ?ID: Carol Dixon, DOB 1975/12/23, MRN [id number] ? ?PCP:  Elenore Paddy, NP  ?Cardiologist:  Nona Dell, MD ?Electrophysiologist:  None  ? ?Chief Complaint  ?Patient presents with  ? Chest discomfort  ? ? ?History of Present Illness: ?Carol Dixon is a 46 y.o. female referred for cardiology consultation by Ms. Wallace Cullens NP for the evaluation of chest discomfort.  We discussed her symptoms today.  Actually, she describes more of a sense of dyspnea on exertion rather than chest pain.  She has been trying to lose weight, started exercising at [employer] about a month ago, typically 3 days a week.  She has been getting on the elliptical machine for about 10 minutes, also doing a workout circuit.  States that her heart rate gets up in the mid 170s which is near her peak based on age, she is short of breath when she exercises, but overall feels well when she is done.   ? ?Both of her parents have heart disease.  She has not undergone any prior cardiac testing.  ECG today is normal. ? ?In the past she lost a significant amount of weight through a weight loss program at [facility], 1000-calorie restrictive diet daily. ? ?She works about 60 hours a week, sits at a computer for several hours at a time.  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 ? ?She has a prior history of tobacco use, now vapes.  We did talk about cutting back and cessation ultimately. ? ?He is not on any standing medications. ? ?Past Medical History:  ?Diagnosis Date  ? Breast hypertrophy 09/2014  ? Dental crown present 09/2014  ? GERD (gastroesophageal reflux disease)   ? History of gastric ulcer   ? Hypertension   ? ? ?Past Surgical History:  ?Procedure Laterality Date  ? BREAST REDUCTION SURGERY Bilateral 10/14/2014  ? Procedure: BILATERAL MAMMARY REDUCTION  (BREAST);  Surgeon: Karie Fetch, MD;  Location: [facility];  Service: Plastics;   Laterality: Bilateral;  ? CESAREAN SECTION  02/20/2012  ? Procedure: CESAREAN SECTION;  Surgeon: Jeani Hawking, MD;  Location: [facility];  Service: Gynecology;  Laterality: N/A;  ? REDUCTION MAMMAPLASTY    ? TONSILLECTOMY  10/17/1994  ? UPPER GASTROINTESTINAL ENDOSCOPY    ? ? ?No current outpatient medications on file.  ? ?No current facility-administered medications for this visit.  ? ?Allergies:  Patient has no known allergies.  ? ?Social History: The patient  reports that she has been smoking cigarettes. She has a 10.00 pack-year smoking history. She has never used smokeless tobacco. She reports that she does not drink alcohol and does not use drugs.  ? ?Family History: The patient's family history includes COPD in her maternal grandmother; Cancer in her paternal grandfather, paternal grandmother, and paternal uncle; Diabetes in her paternal aunt; Hyperlipidemia in her father and paternal aunt; Hypertension in her father and mother; Stroke in her maternal aunt and maternal uncle.  ? ?ROS: No palpitations or syncope. ? ?Physical Exam: ?VS:  BP 138/88   Pulse 84   Ht 4\' 11"  (1.499 m)   Wt 194 lb 3.2 oz (88.1 kg)   BMI 39.22 kg/m? , BMI Body mass index is 39.22 kg/m?. ? ?Wt Readings from Last 3 Encounters:  ?01/13/22 194 lb 3.2 oz (88.1 kg)  ?12/10/21 193 lb (87.5 kg)  ?11/05/21 192 lb (87.1 kg)  ?  ?General:  Patient appears comfortable at rest. ?HEENT: Conjunctiva and lids normal. ?Neck: Supple, no elevated JVP or carotid bruits. ?Lungs: Clear to auscultation, nonlabored breathing at rest. ?Cardiac: Regular rate and rhythm, no S3 or significant systolic murmur, no pericardial rub. ?Abdomen: Bowel sounds present. ?Extremities: No pitting edema, distal pulses 2+. ?Skin: Warm and dry. ?Musculoskeletal: No kyphosis. ?Neuropsychiatric: Alert and oriented x3, affect grossly appropriate. ? ?ECG:  An ECG dated 06/04/2015 was personally reviewed today and demonstrated:  Sinus rhythm. ? ?Recent Labwork: ?11/05/2021: ALT  19; AST 17; BUN 13; Creatinine, Ser 0.81; Hemoglobin 14.7; Platelets 308.0; Potassium 4.2; Sodium 139; TSH 2.04  ?   ?Component Value Date/Time  ? CHOL 184 11/05/2021 1436  ? TRIG 115.0 11/05/2021 1436  ? HDL 46.70 11/05/2021 1436  ? CHOLHDL 4 11/05/2021 1436  ? VLDL 23.0 11/05/2021 1436  ? LDLCALC 114 (H) 11/05/2021 1436  ? ? ?Other Studies Reviewed Today: ? ?No prior cardiac testing for review. ? ?Assessment and Plan: ? ?Dyspnea on exertion in a 46 year old woman with family history of heart disease.  Her current BMI is approximately 46 and she is trying to lose weight, recently started exercising about a month ago.  I reviewed her most recent lab work, ECG today is normal.  She has not undergone any prior cardiac ischemic or structural evaluation.  We will obtain an echocardiogram and a GXT for screening purposes.  We otherwise discussed reasonable dietary recommendations and also plan to continue exercise presuming her cardiac work-up is reassuring. ? ?Medication Adjustments/Labs and Tests Ordered: ?Current medicines are reviewed at length with the patient today.  Concerns regarding medicines are outlined above.  ? ?Tests Ordered: ?Orders Placed This Encounter  ?Procedures  ? EXERCISE TOLERANCE TEST (ETT)  ? EKG 12-Lead  ? ECHOCARDIOGRAM COMPLETE  ? ? ?Medication Changes: ?No orders of the defined types were placed in this encounter. ? ? ?Disposition:  Follow up  test results. ? ?Signed, ?Jonelle Sidle, MD, [facility] ?01/13/2022 11:08 AM    ?[facility] ?[address] [place], [place], [place] [address] ?Phone: [phone]; Fax: [phone]  ?

## 2022-01-17 DIAGNOSIS — I10 Essential (primary) hypertension: Secondary | ICD-10-CM | POA: Insufficient documentation

## 2022-01-19 ENCOUNTER — Ambulatory Visit (INDEPENDENT_AMBULATORY_CARE_PROVIDER_SITE_OTHER): Payer: 59

## 2022-01-19 DIAGNOSIS — R0609 Other forms of dyspnea: Secondary | ICD-10-CM

## 2022-01-19 DIAGNOSIS — R0789 Other chest pain: Secondary | ICD-10-CM

## 2022-01-20 LAB — ECHOCARDIOGRAM COMPLETE
Area-P 1/2: 5.02 cm2
S' Lateral: 2.7 cm

## 2022-01-21 ENCOUNTER — Encounter (HOSPITAL_COMMUNITY): Payer: 59

## 2022-01-21 ENCOUNTER — Ambulatory Visit (HOSPITAL_COMMUNITY)
Admission: RE | Admit: 2022-01-21 | Discharge: 2022-01-21 | Disposition: A | Discharge status: Home / Self Care | Payer: 59 | Source: Ambulatory Visit | Attending: Cardiology | Admitting: Cardiology

## 2022-01-21 DIAGNOSIS — R0789 Other chest pain: Secondary | ICD-10-CM

## 2022-01-21 DIAGNOSIS — R0609 Other forms of dyspnea: Secondary | ICD-10-CM | POA: Diagnosis not present

## 2022-01-21 LAB — EXERCISE TOLERANCE TEST
Angina Index: 0
MPHR: 194 {beats}/min
Peak HR: 153 {beats}/min
RPE: 15
Rest HR: 86 {beats}/min
ST Depression (mm): 0 mm

## 2022-02-10 ENCOUNTER — Other Ambulatory Visit: Payer: 59

## 2022-02-11 ENCOUNTER — Encounter: Payer: Self-pay | Admitting: Emergency Medicine

## 2022-02-11 ENCOUNTER — Other Ambulatory Visit: Payer: Self-pay

## 2022-02-11 ENCOUNTER — Ambulatory Visit
Admission: EM | Admit: 2022-02-11 | Discharge: 2022-02-11 | Disposition: A | Discharge status: Home / Self Care | Payer: 59 | Attending: Nurse Practitioner | Admitting: Nurse Practitioner

## 2022-02-11 DIAGNOSIS — B9689 Other specified bacterial agents as the cause of diseases classified elsewhere: Secondary | ICD-10-CM

## 2022-02-11 DIAGNOSIS — J019 Acute sinusitis, unspecified: Secondary | ICD-10-CM

## 2022-02-11 MED ORDER — BENZONATATE 100 MG PO CAPS: 100.0000 mg | ORAL_CAPSULE | Freq: Three times a day (TID) | ORAL | 0 refills | Status: DC | PRN

## 2022-02-11 MED ORDER — AMOXICILLIN-POT CLAVULANATE 875-125 MG PO TABS
1.0000 | ORAL_TABLET | Freq: Two times a day (BID) | ORAL | 0 refills | Status: DC
Start: 2022-02-11 — End: 2022-06-24

## 2022-02-11 MED ORDER — PROMETHAZINE-DM 6.25-15 MG/5ML PO SYRP: 5.0000 mL | ORAL_SOLUTION | Freq: Four times a day (QID) | ORAL | 0 refills | Status: DC | PRN

## 2022-02-11 MED ORDER — FLUTICASONE PROPIONATE 50 MCG/ACT NA SUSP: 2.0000 | Freq: Every day | NASAL | 0 refills | Status: DC

## 2022-02-11 NOTE — ED Provider Notes (Signed)
?[facility] ? ? ? ?CSN: [id number] ?Arrival date & time: 02/11/22  1630 ? ? ?  ? ?History   ?Chief Complaint ?Chief Complaint  ?Patient presents with  ? Cough  ? ? ?HPI ?Carol Dixon is a 46 y.o. female.  ? ?Patient is a 46 year old female who presents with upper respiratory symptoms.  Symptoms have been present for the past 2 weeks.  She states that she took over-the-counter sinus medication, symptoms improved, but then they became worse.  Today she complains of persistent cough, nasal congestion,facial pressure, jaw and teeth aching.  Patient also complains of intermittent fever.  She states that she has been coughing so hard that she has vomited, denies wheezing or shortness of breath.  She also states cough has been worse at night.  The patient has had decreased appetite, but denies GI symptoms. ? ?The history is provided by the patient.  ? ?Past Medical History:  ?Diagnosis Date  ? Breast hypertrophy 09/2014  ? Dental crown present 09/2014  ? GERD (gastroesophageal reflux disease)   ? History of gastric ulcer   ? Hypertension   ? ? ?Patient Active Problem List  ? Diagnosis Date Noted  ? Class 3 severe obesity without serious comorbidity with body mass index (BMI) of 40.0 to 44.9 in adult [facility]) 12/10/2021  ? Hyperlipidemia 12/10/2021  ? Intermittent chest pain 12/10/2021  ? Allergic contact dermatitis due to plants, except food 03/24/2021  ? Tobacco use 08/14/2018  ? Screening for thyroid disorder 07/05/2018  ? Low HDL (under 40) 10/12/2017  ? Obesity, Class I, BMI 30-34.9 10/12/2017  ? Prediabetes 10/12/2017  ? Abnormal first trimester screen 09/30/2011  ? Elderly primigravida, antepartum 09/30/2011  ? ? ?Past Surgical History:  ?Procedure Laterality Date  ? BREAST REDUCTION SURGERY Bilateral 10/14/2014  ? Procedure: BILATERAL MAMMARY REDUCTION  (BREAST);  Surgeon: Karie FetchMary A Contogiannis, MD;  Location: [facility];  Service: Plastics;  Laterality: Bilateral;  ? CESAREAN SECTION   02/20/2012  ? Procedure: CESAREAN SECTION;  Surgeon: Jeani HawkingMichelle L Grewal, MD;  Location: [facility];  Service: Gynecology;  Laterality: N/A;  ? REDUCTION MAMMAPLASTY    ? TONSILLECTOMY  10/17/1994  ? UPPER GASTROINTESTINAL ENDOSCOPY    ? ? ?OB History   ? ? Gravida  ?1  ? Para  ?1  ? Term  ?0  ? Preterm  ?1  ? AB  ?0  ? Living  ?1  ?  ? ? SAB  ?0  ? IAB  ?0  ? Ectopic  ?0  ? Multiple  ?0  ? Live Births  ?1  ?   ?  ?  ? ? ? ?Home Medications   ? ?Prior to Admission medications   ?Medication Sig Start Date End Date Taking? Authorizing Provider  ?amoxicillin-clavulanate (AUGMENTIN) 875-125 MG tablet Take 1 tablet by mouth every 12 (twelve) hours. 02/11/22  Yes Nakai Pollio-Warren, Sadie Haberhristie J, NP  ?benzonatate (TESSALON PERLES) 100 MG capsule Take 1 capsule (100 mg total) by mouth 3 (three) times daily as needed for cough. 02/11/22  Yes Gurkirat Basher-Warren, Sadie Haberhristie J, NP  ?fluticasone (FLONASE) 50 MCG/ACT nasal spray Place 2 sprays into both nostrils daily. 02/11/22  Yes Evens Meno-Warren, Sadie Haberhristie J, NP  ?promethazine-dextromethorphan (PROMETHAZINE-DM) 6.25-15 MG/5ML syrup Take 5 mLs by mouth 4 (four) times daily as needed for cough. 02/11/22  Yes Analy Bassford-Warren, Sadie Haberhristie J, NP  ? ? ?Family History ?Family History  ?Problem Relation Age of Onset  ? Hypertension Mother   ? Hypertension Father   ?  Hyperlipidemia Father   ? Stroke Maternal Aunt   ? Stroke Maternal Uncle   ? Diabetes Paternal Aunt   ? Hyperlipidemia Paternal Aunt   ? Cancer Paternal Uncle   ? COPD Maternal Grandmother   ? Cancer Paternal Grandmother   ? Cancer Paternal Grandfather   ? ? ?Social History ?Social History  ? ?Tobacco Use  ? Smoking status: Every Day  ?  Packs/day: 0.50  ?  Years: 20.00  ?  Pack years: 10.00  ?  Types: Cigarettes  ? Smokeless tobacco: Never  ?Substance Use Topics  ? Alcohol use: No  ? Drug use: No  ? ? ? ?Allergies   ?Patient has no known allergies. ? ? ?Review of Systems ?Review of Systems  ?Constitutional:  Positive for activity change, appetite change  and fatigue.  ?HENT:  Positive for congestion, postnasal drip, sinus pressure and sinus pain.   ?Eyes: Negative.   ?Cardiovascular: Negative.   ?Gastrointestinal: Negative.   ?Skin: Negative.   ?Psychiatric/Behavioral: Negative.    ? ? ?Physical Exam ?Triage Vital Signs ?ED Triage Vitals  ?Enc Vitals Group  ?   BP 02/11/22 1907 124/84  ?   Pulse Rate 02/11/22 1907 96  ?   Resp 02/11/22 1907 (!) 21  ?   Temp 02/11/22 1907 97.6 ?F (36.4 ?C)  ?   Temp Source 02/11/22 1907 Oral  ?   SpO2 02/11/22 1907 98 %  ?   Weight 02/11/22 1911 190 lb (86.2 kg)  ?   Height 02/11/22 1911 4\' 10"  (1.473 m)  ?   Head Circumference --   ?   Peak Flow --   ?   Pain Score 02/11/22 1911 6  ?   Pain Loc --   ?   Pain Edu? --   ?   Excl. in GC? --   ? ?No data found. ? ?Updated Vital Signs ?BP 124/84 (BP Location: Right Arm)   Pulse 96   Temp 97.6 ?F (36.4 ?C) (Oral)   Resp (!) 21   Ht 4\' 10"  (1.473 m)   Wt 190 lb (86.2 kg)   SpO2 98%   BMI 39.71 kg/m?  ? ?Visual Acuity ?Right Eye Distance:   ?Left Eye Distance:   ?Bilateral Distance:   ? ?Right Eye Near:   ?Left Eye Near:    ?Bilateral Near:    ? ?Physical Exam ?Vitals reviewed.  ?Constitutional:   ?   General: She is in acute distress (moderate coughing during examination).  ?   Appearance: Normal appearance.  ?HENT:  ?   Head: Normocephalic.  ?   Right Ear: Tympanic membrane, ear canal and external ear normal.  ?   Left Ear: Tympanic membrane, ear canal and external ear normal.  ?   Nose: Congestion and rhinorrhea present.  ?   Right Turbinates: Enlarged and swollen.  ?   Left Turbinates: Enlarged and swollen.  ?   Right Sinus: Maxillary sinus tenderness and frontal sinus tenderness present.  ?   Left Sinus: Maxillary sinus tenderness and frontal sinus tenderness present.  ?   Mouth/Throat:  ?   Mouth: Mucous membranes are moist.  ?   Pharynx: Posterior oropharyngeal erythema present. No oropharyngeal exudate.  ?Eyes:  ?   Extraocular Movements: Extraocular movements intact.  ?    Conjunctiva/sclera: Conjunctivae normal.  ?   Pupils: Pupils are equal, round, and reactive to light.  ?Cardiovascular:  ?   Rate and Rhythm: Normal rate and regular rhythm.  ?  Pulses: Normal pulses.  ?   Heart sounds: Normal heart sounds.  ?Pulmonary:  ?   Effort: Pulmonary effort is normal.  ?   Breath sounds: Normal breath sounds.  ?Abdominal:  ?   General: Bowel sounds are normal.  ?   Palpations: Abdomen is soft.  ?   Tenderness: There is no abdominal tenderness.  ?Musculoskeletal:  ?   Cervical back: Normal range of motion.  ?Lymphadenopathy:  ?   Cervical: No cervical adenopathy.  ?Skin: ?   General: Skin is warm and dry.  ?   Capillary Refill: Capillary refill takes less than 2 seconds.  ?Neurological:  ?   General: No focal deficit present.  ?   Mental Status: She is alert and oriented to person, place, and time.  ?Psychiatric:     ?   Mood and Affect: Mood normal.     ?   Behavior: Behavior normal.     ?   Thought Content: Thought content normal.     ?   Judgment: Judgment normal.  ? ? ? ?UC Treatments / Results  ?Labs ?(all labs ordered are listed, but only abnormal results are displayed) ?Labs Reviewed - No data to display ? ?EKG ? ? ?Radiology ?No results found. ? ?Procedures ?Procedures (including critical care time) ? ?Medications Ordered in UC ?Medications - No data to display ? ?Initial Impression / Assessment and Plan / UC Course  ?I have reviewed the triage vital signs and the nursing notes. ? ?Pertinent labs & imaging results that were available during my care of the patient were reviewed by me and considered in my medical decision making (see chart for details). ? ?The patient is a 46 year old female who presents with upper respiratory symptoms.  Patient states that she tried over-the-counter medications approximately 2 weeks ago, symptoms improved, but over the past several days seem to return.  She presents today with moderate coughing, nasal congestion, frontal and maxillary sinus  tenderness.  She has also had a fever within the past 48 hours.  Based on her current presentation to include rebound symptoms, facial pressure and tenderness, and tooth pain, symptoms are consistent with an acute bacterial sinu

## 2022-02-11 NOTE — Discharge Instructions (Signed)
Take medication as prescribed. ?Increase fluids and get plenty of rest. ?Recommend using a humidifier to help with cough and nasal congestion. ?Sleep elevated on 2 pillows to help with your cough. ?May take ibuprofen or Tylenol for pain, fever, or general discomfort. ?Follow-up if symptoms do not improve within the next 3 to 5 days. ?

## 2022-02-11 NOTE — ED Triage Notes (Signed)
Pt reports cough, nasal congestion, facial pressure, jaw and teeth aching x1 week. ? ?Pt reports history of similar a few weeks ago and reports all cleared up with otc sinus medication. Pt reports this time nothing otc is working. ?

## 2022-06-17 ENCOUNTER — Ambulatory Visit: Payer: 59 | Admitting: Nurse Practitioner

## 2022-06-24 ENCOUNTER — Encounter: Payer: Self-pay | Admitting: Nurse Practitioner

## 2022-06-24 ENCOUNTER — Ambulatory Visit (INDEPENDENT_AMBULATORY_CARE_PROVIDER_SITE_OTHER): Payer: 59 | Admitting: Nurse Practitioner

## 2022-06-24 VITALS — BP 156/98 | HR 85 | Temp 98.0°F | Ht <= 58 in | Wt 191.4 lb

## 2022-06-24 DIAGNOSIS — Z1211 Encounter for screening for malignant neoplasm of colon: Secondary | ICD-10-CM

## 2022-06-24 DIAGNOSIS — I10 Essential (primary) hypertension: Secondary | ICD-10-CM

## 2022-06-24 DIAGNOSIS — Z23 Encounter for immunization: Secondary | ICD-10-CM

## 2022-06-24 DIAGNOSIS — Z6841 Body Mass Index (BMI) 40.0 and over, adult: Secondary | ICD-10-CM | POA: Diagnosis not present

## 2022-06-24 MED ORDER — WEGOVY 0.25 MG/0.5ML ~~LOC~~ SOAJ: 0.2500 mg | SUBCUTANEOUS | 1 refills | Status: DC

## 2022-06-24 NOTE — Assessment & Plan Note (Signed)
Flu shot to be administered today. 

## 2022-06-24 NOTE — Patient Instructions (Addendum)
Docusate sodium - take this stool softener per package instructions as needed for constipation Senna - S - take for constipation as needed if after 2 days of use of docusate sodium you have still not had a bowel movement  Monitor calorie intake try to get around 1100-1200/day. Eat mainly lean protein and vegetables.  Monitor BP at home and keep log. Have these ready for when I see you next.

## 2022-06-24 NOTE — Assessment & Plan Note (Signed)
Per shared decision making we will trial Ozempic 0.25 mg per subcutaneous injection once a week.  Patient educated on possible side effects and to notify me if she has severe nausea, constipation, and/or abdominal pain.  She reports her understanding.  We will follow-up in approximately 4 weeks to see how she is tolerating it and consider increasing dose.

## 2022-06-24 NOTE — Assessment & Plan Note (Addendum)
Pressure decision making patient will complete Cologuard.  She is willing to undergo colonoscopy if Cologuard is positive.  She is aware that Cologuard is a bit less sensitive than colonoscopy and that the gold standard is colonoscopy.  Cologuard ordered today.  Further recommendations may be made based upon his results.

## 2022-06-24 NOTE — Assessment & Plan Note (Signed)
Blood pressure elevated today despite being rechecked after sitting in the room for a few minutes.  Previous recorded blood pressures have been much better.  Patient did recently come back from the beach where she did eat at restaurants.  May be due to extra salt intake?  For now patient will monitor blood pressure at home, and will notify me if she has 2-3 or more readings of systolic blood pressure around 160 or greater and/or diastolic blood pressure around 100 or greater.  May consider addition of antihypertensive agent, briefly discussed risks and benefits of ARB, calcium channel blocker, and thiazide diuretic today.  Further recommendations may be made based upon patient's at home blood pressure readings.

## 2022-06-24 NOTE — Progress Notes (Signed)
Established Patient Office Visit  Subjective   Patient ID: Carol Dixon, female    DOB: 04-Jun-1976  Age: 46 y.o. MRN: [id number]  Chief Complaint  Patient presents with   Obesity    Patient arrives today for the above. She was seen last approximately 6 months ago.  Between now and then she continues to focus on lifestyle changes.  She has been increasing her exercise by going to [employer], she also tells me she been walking at least 1 mile a day.  She does try to follow a healthy diet, she has lost weight following a calorie deficit of eating 1000 to 1100 cal a day in the past but feels that this is unrealistic related to her lifestyle to maintain.  She would like to discuss pharmacological options to assist with weight loss.  She reports that she does not have any family history or personal history of thyroid cancer.  She also mentions she has a uncle who had colon cancer but otherwise no history of colon cancer.  She does need colon cancer screening.  She has never had colonoscopy therefore does not know if she has polyps, she has never been diagnosed with ulcerative colitis or Crohn's.  She would prefer to avoid colonoscopy if possible due to fear of possible risks associated with the invasiveness of that procedure.  She is also requesting to have flu shot administered today.    Review of Systems  Eyes:  Negative for blurred vision.  Respiratory:  Negative for shortness of breath.   Cardiovascular:  Negative for chest pain.      Objective:     BP (!) 156/98   Pulse 85   Temp 98 F (36.7 C) (Oral)   Ht 4\' 10"  (1.473 m)   Wt 191 lb 6 oz (86.8 kg)   SpO2 98%   BMI 40.00 kg/m  BP Readings from Last 3 Encounters:  06/24/22 (!) 156/98  02/11/22 124/84  01/13/22 138/88   Wt Readings from Last 3 Encounters:  06/24/22 191 lb 6 oz (86.8 kg)  02/11/22 190 lb (86.2 kg)  01/13/22 194 lb 3.2 oz (88.1 kg)      Physical Exam Vitals reviewed.  Constitutional:      General:  She is not in acute distress.    Appearance: Normal appearance.  HENT:     Head: Normocephalic and atraumatic.  Neck:     Vascular: No carotid bruit.  Cardiovascular:     Rate and Rhythm: Normal rate and regular rhythm.     Pulses: Normal pulses.     Heart sounds: Normal heart sounds.  Pulmonary:     Effort: Pulmonary effort is normal.     Breath sounds: Normal breath sounds.  Skin:    General: Skin is warm and dry.  Neurological:     General: No focal deficit present.     Mental Status: She is alert and oriented to person, place, and time.  Psychiatric:        Mood and Affect: Mood normal.        Behavior: Behavior normal.        Judgment: Judgment normal.      No results found for any visits on 06/24/22.    The 10-year ASCVD risk score (Arnett DK, et al., 2019) is: 4.6%    Assessment & Plan:   Problem List Items Addressed This Visit       Cardiovascular and Mediastinum   Hypertensive disorder    Blood  pressure elevated today despite being rechecked after sitting in the room for a few minutes.  Previous recorded blood pressures have been much better.  Patient did recently come back from the beach where she did eat at restaurants.  May be due to extra salt intake?  For now patient will monitor blood pressure at home, and will notify me if she has 2-3 or more readings of systolic blood pressure around 160 or greater and/or diastolic blood pressure around 100 or greater.  May consider addition of antihypertensive agent, briefly discussed risks and benefits of ARB, calcium channel blocker, and thiazide diuretic today.  Further recommendations may be made based upon patient's at home blood pressure readings.        Other   Class 3 severe obesity without serious comorbidity with body mass index (BMI) of 40.0 to 44.9 in adult [facility])    Per shared decision making we will trial Ozempic 0.25 mg per subcutaneous injection once a week.  Patient educated on possible side effects and  to notify me if she has severe nausea, constipation, and/or abdominal pain.  She reports her understanding.  We will follow-up in approximately 4 weeks to see how she is tolerating it and consider increasing dose.      Relevant Medications   Semaglutide-Weight Management (WEGOVY) 0.25 MG/0.5ML SOAJ   Need for vaccination - Primary    Flu shot to be administered today.      Relevant Orders   Flu Vaccine QUAD 6+ mos PF IM (Fluarix Quad PF) (Completed)   Colon cancer screening    Pressure decision making patient will complete Cologuard.  She is willing to undergo colonoscopy if Cologuard is positive.  She is aware that Cologuard is a bit less sensitive than colonoscopy and that the gold standard is colonoscopy.  Cologuard ordered today.  Further recommendations may be made based upon his results.      Relevant Orders   Cologuard    Return in about 4 weeks (around 07/22/2022) for F/U with [facility].    Elenore Paddy, NP

## 2022-07-05 ENCOUNTER — Telehealth: Payer: 59 | Admitting: Physician Assistant

## 2022-07-05 DIAGNOSIS — U071 COVID-19: Secondary | ICD-10-CM | POA: Diagnosis not present

## 2022-07-05 MED ORDER — NIRMATRELVIR/RITONAVIR (PAXLOVID)TABLET: 3.0000 | ORAL_TABLET | Freq: Two times a day (BID) | ORAL | 0 refills | Status: AC

## 2022-07-05 MED ORDER — BENZONATATE 100 MG PO CAPS: 100.0000 mg | ORAL_CAPSULE | Freq: Three times a day (TID) | ORAL | 0 refills | Status: DC | PRN

## 2022-07-05 NOTE — Progress Notes (Signed)
   Thank you for the details you included in the comment boxes. Those details are very helpful in determining the best course of treatment for you and help [place] to provide the best care.Because you have a history of high blood pressure and cholesterol which can increase risks with COVID, we recommend that you convert this visit to a video visit in order for the provider to better assess what is going on and so we can discuss possibility of antiviral medications.  The provider will be able to give you a more accurate diagnosis and treatment plan if we can more freely discuss your symptoms and with the addition of a virtual examination.   If you convert to a video visit, we will bill your insurance (similar to an office visit) and you will not be charged for this e-Visit. You will be able to stay at home and speak with the first available [facility] advanced practice provider. The link to do a video visit is in the drop down Menu tab of your Welcome screen in [place].

## 2022-07-05 NOTE — Patient Instructions (Signed)
Carol Dixon, thank you for joining Carol Dixon, Carol Dixon for today's virtual visit.  While this provider is not your primary care provider (PCP), if your PCP is located in our provider database this encounter information will be shared with them immediately following your visit.  Consent: (Patient) Carol Dixon provided verbal consent for this virtual visit at the beginning of the encounter.  Current Medications:  Current Outpatient Medications:    Semaglutide-Weight Management (WEGOVY) 0.25 MG/0.5ML SOAJ, Inject 0.25 mg into the skin once a week., Disp: 2 mL, Rfl: 1   Medications ordered in this encounter:  No orders of the defined types were placed in this encounter.    *If you need refills on other medications prior to your next appointment, please contact your pharmacy*  Follow-Up: Call back or seek an in-person evaluation if the symptoms worsen or if the condition fails to improve as anticipated.  Other Instructions Please keep well-hydrated and get plenty of rest. Start a saline nasal rinse to flush out your nasal passages. You can use plain Mucinex to help thin congestion. If you have a humidifier, running in the bedroom at night. I want you to start OTC vitamin D3 1000 units daily, vitamin C 1000 mg daily, and a zinc supplement. Please take prescribed medications as directed.  You have been enrolled in a MyChart symptom monitoring program. Please answer these questions daily so we can keep track of how you are doing.  You were to quarantine for 5 days from onset of your symptoms.  After day 5, if you have had no fever and you are feeling better, you can end quarantine but need to mask for an additional 5 days. After day 5 if you have a fever or are having significant symptoms, please quarantine for full 10 days.  If you note any worsening of symptoms, any significant shortness of breath or any chest pain, please seek ER evaluation ASAP.  Please do not delay  care!  COVID-19: What to Do if You Are Sick If you test positive and are an older adult or someone who is at high risk of getting very sick from COVID-19, treatment may be available. Contact a healthcare provider right away after a positive test to determine if you are eligible, even if your symptoms are mild right now. You can also visit a Test to Treat location and, if eligible, receive a prescription from a provider. Don't delay: Treatment must be started within the first few days to be effective. If you have a fever, cough, or other symptoms, you might have COVID-19. Most people have mild illness and are able to recover at home. If you are sick: Keep track of your symptoms. If you have an emergency warning sign (including trouble breathing), call 911. Steps to help prevent the spread of COVID-19 if you are sick If you are sick with COVID-19 or think you might have COVID-19, follow the steps below to care for yourself and to help protect other people in your home and community. Stay home except to get medical care Stay home. Most people with COVID-19 have mild illness and can recover at home without medical care. Do not leave your home, except to get medical care. Do not visit public areas and do not go to places where you are unable to wear a mask. Take care of yourself. Get rest and stay hydrated. Take over-the-counter medicines, such as acetaminophen, to help you feel better. Stay in touch with your doctor. Call before  you get medical care. Be sure to get care if you have trouble breathing, or have any other emergency warning signs, or if you think it is an emergency. Avoid public transportation, ride-sharing, or taxis if possible. Get tested If you have symptoms of COVID-19, get tested. While waiting for test results, stay away from others, including staying apart from those living in your household. Get tested as soon as possible after your symptoms start. Treatments may be available for  people with COVID-19 who are at risk for becoming very sick. Don't delay: Treatment must be started early to be effective--some treatments must begin within 5 days of your first symptoms. Contact your healthcare provider right away if your test result is positive to determine if you are eligible. Self-tests are one of several options for testing for the virus that causes COVID-19 and may be more convenient than laboratory-based tests and point-of-care tests. Ask your healthcare provider or your local health department if you need help interpreting your test results. You can visit your state, tribal, local, and territorial health department's website to look for the latest local information on testing sites. Separate yourself from other people As much as possible, stay in a specific room and away from other people and pets in your home. If possible, you should use a separate bathroom. If you need to be around other people or animals in or outside of the home, wear a well-fitting mask. Tell your close contacts that they may have been exposed to COVID-19. An infected person can spread COVID-19 starting 48 hours (or 2 days) before the person has any symptoms or tests positive. By letting your close contacts know they may have been exposed to COVID-19, you are helping to protect everyone. See COVID-19 and Animals if you have questions about pets. If you are diagnosed with COVID-19, someone from the health department may call you. Answer the call to slow the spread. Monitor your symptoms Symptoms of COVID-19 include fever, cough, or other symptoms. Follow care instructions from your healthcare provider and local health department. Your local health authorities may give instructions on checking your symptoms and reporting information. When to seek emergency medical attention Look for emergency warning signs* for COVID-19. If someone is showing any of these signs, seek emergency medical care  immediately: Trouble breathing Persistent pain or pressure in the chest New confusion Inability to wake or stay awake Pale, gray, or blue-colored skin, lips, or nail beds, depending on skin tone *This list is not all possible symptoms. Please call your medical provider for any other symptoms that are severe or concerning to you. Call 911 or call ahead to your local emergency facility: Notify the operator that you are seeking care for someone who has or may have COVID-19. Call ahead before visiting your doctor Call ahead. Many medical visits for routine care are being postponed or done by phone or telemedicine. If you have a medical appointment that cannot be postponed, call your doctor's office, and tell them you have or may have COVID-19. This will help the office protect themselves and other patients. If you are sick, wear a well-fitting mask You should wear a mask if you must be around other people or animals, including pets (even at home). Wear a mask with the best fit, protection, and comfort for you. You don't need to wear the mask if you are alone. If you can't put on a mask (because of trouble breathing, for example), cover your coughs and sneezes in some other way.  Try to stay at least 6 feet away from other people. This will help protect the people around you. Masks should not be placed on Carol Dixon, anyone who has trouble breathing, or anyone who is not able to remove the mask without help. Cover your coughs and sneezes Cover your mouth and nose with a tissue when you cough or sneeze. Throw away used tissues in a lined trash can. Immediately wash your hands with soap and water for at least 20 seconds. If soap and water are not available, clean your hands with an alcohol-based hand sanitizer that contains at least 60% alcohol. Clean your hands often Wash your hands often with soap and water for at least 20 seconds. This is especially important after blowing your  nose, coughing, or sneezing; going to the bathroom; and before eating or preparing food. Use hand sanitizer if soap and water are not available. Use an alcohol-based hand sanitizer with at least 60% alcohol, covering all surfaces of your hands and rubbing them together until they feel dry. Soap and water are the best option, especially if hands are visibly dirty. Avoid touching your eyes, nose, and mouth with unwashed hands. Handwashing Tips Avoid sharing personal household items Do not share dishes, drinking glasses, cups, eating utensils, towels, or bedding with other people in your home. Wash these items thoroughly after using them with soap and water or put in the dishwasher. Clean surfaces in your home regularly Clean and disinfect high-touch surfaces (for example, doorknobs, tables, handles, light switches, and countertops) in your "sick room" and bathroom. In shared spaces, you should clean and disinfect surfaces and items after each use by the person who is ill. If you are sick and cannot clean, a caregiver or other person should only clean and disinfect the area around you (such as your bedroom and bathroom) on an as needed basis. Your caregiver/other person should wait as long as possible (at least several hours) and wear a mask before entering, cleaning, and disinfecting shared spaces that you use. Clean and disinfect areas that may have blood, stool, or body fluids on them. Use household cleaners and disinfectants. Clean visible dirty surfaces with household cleaners containing soap or detergent. Then, use a household disinfectant. Use a product from [employer] List N: Disinfectants for Coronavirus (DXAJO-87). Be sure to follow the instructions on the label to ensure safe and effective use of the product. Many products recommend keeping the surface wet with a disinfectant for a certain period of time (look at "contact time" on the product label). You may also need to wear personal protective  equipment, such as gloves, depending on the directions on the product label. Immediately after disinfecting, wash your hands with soap and water for 20 seconds. For completed guidance on cleaning and disinfecting your home, visit Complete Disinfection Guidance. Take steps to improve ventilation at home Improve ventilation (air flow) at home to help prevent from spreading COVID-19 to other people in your household. Clear out COVID-19 virus particles in the air by opening windows, using air filters, and turning on fans in your home. Use this interactive tool to learn how to improve air flow in your home. When you can be around others after being sick with COVID-19 Deciding when you can be around others is different for different situations. Find out when you can safely end home isolation. For any additional questions about your care, contact your healthcare provider or state or local health department. 01/05/2021 Content source: [facility]  for Immunization and Respiratory Diseases ([facility]), Division of Viral Diseases This information is not intended to replace advice given to you by your health care provider. Make sure you discuss any questions you have with your health care provider. Document Revised: 02/18/2021 Document Reviewed: 02/18/2021 Elsevier Patient Education  2022 [employer].   If you have been instructed to have an in-person evaluation today at a local Urgent Care facility, please use the link below. It will take you to a list of all of our available [facility], including address, phone number and hours of operation. Please do not delay care.  [facility]  If you or a family member do not have a primary care provider, use the link below to schedule a visit and establish care. When you choose a [facility] primary care physician or advanced practice provider, you gain a long-term partner in health. Find a Primary Care Provider  Learn more about [facility]'s in-office and virtual care options: [facility] - Get Care Now

## 2022-07-05 NOTE — Progress Notes (Signed)
Virtual Visit Consent   Carol Dixon, you are scheduled for a virtual visit with a [facility] provider today. Just as with appointments in the office, your consent must be obtained to participate. Your consent will be active for this visit and any virtual visit you may have with one of our providers in the next 365 days. If you have a MyChart account, a copy of this consent can be sent to you electronically.  As this is a virtual visit, video technology does not allow for your provider to perform a traditional examination. This may limit your provider's ability to fully assess your condition. If your provider identifies any concerns that need to be evaluated in person or the need to arrange testing (such as labs, EKG, etc.), we will make arrangements to do so. Although advances in technology are sophisticated, we cannot ensure that it will always work on either your end or our end. If the connection with a video visit is poor, the visit may have to be switched to a telephone visit. With either a video or telephone visit, we are not always able to ensure that we have a secure connection.  By engaging in this virtual visit, you consent to the provision of healthcare and authorize for your insurance to be billed (if applicable) for the services provided during this visit. Depending on your insurance coverage, you may receive a charge related to this service.  I need to obtain your verbal consent now. Are you willing to proceed with your visit today? ALANTRA POPOCA has provided verbal consent on 07/05/2022 for a virtual visit (video or telephone). Carol Dixon, [place]  Date: 07/05/2022 1:23 PM  Virtual Visit via Video Note   I, Carol Dixon, connected with  MAHKAYLA PREECE  ([id number], Oct 29, 1975) on 07/05/22 at  1:15 PM EDT by a video-enabled telemedicine application and verified that I am speaking with the correct person using two identifiers.  Location: Patient: Virtual Visit Location  Patient: Home Provider: Virtual Visit Location Provider: Home Office   I discussed the limitations of evaluation and management by telemedicine and the availability of in person appointments. The patient expressed understanding and agreed to proceed.    History of Present Illness: Carol Dixon is a 46 y.o. who identifies as a female who was assigned female at birth, and is being seen today for COVID-19.  Was seen via e-visit and recommended to do video visit giving history of obesity, prediabetes, hypertension, etc. Symptoms starting yesterday with abrupt onset of head and chest congestion, headache, fever and fatigue. Son and patient both testing positive for COVID last night. Is trying to hydrate and rest. Denies chest pain or SOB. Has not had COVID before that she knows of.  HPI: HPI  Problems:  Patient Active Problem List   Diagnosis Date Noted   Need for vaccination 06/24/2022   Colon cancer screening 06/24/2022   Hypertensive disorder 01/17/2022   Class 3 severe obesity without serious comorbidity with body mass index (BMI) of 40.0 to 44.9 in adult [facility]) 12/10/2021   Hyperlipidemia 12/10/2021   Intermittent chest pain 12/10/2021   Allergic contact dermatitis due to plants, except food 03/24/2021   Tobacco use 08/14/2018   Low HDL (under 40) 10/12/2017   Prediabetes 10/12/2017   Abnormal first trimester screen 09/30/2011   Elderly primigravida, antepartum 09/30/2011    Allergies: No Known Allergies Medications:  Current Outpatient Medications:    benzonatate (TESSALON) 100 MG capsule, Take 1 capsule (100  mg total) by mouth 3 (three) times daily as needed for cough., Disp: 30 capsule, Rfl: 0   nirmatrelvir/ritonavir EUA (PAXLOVID) 20 x 150 MG & 10 x 100MG  TABS, Take 3 tablets by mouth 2 (two) times daily for 5 days. (Take nirmatrelvir 150 mg two tablets twice daily for 5 days and ritonavir 100 mg one tablet twice daily for 5 days) Patient GFR is 88, Disp: 30 tablet, Rfl: 0    Semaglutide-Weight Management (WEGOVY) 0.25 MG/0.5ML SOAJ, Inject 0.25 mg into the skin once a week., Disp: 2 mL, Rfl: 1  Observations/Objective: Patient is well-developed, well-nourished in no acute distress.  Resting comfortably at home.  Head is normocephalic, atraumatic.  No labored breathing. Speech is clear and coherent with logical content.  Patient is alert and oriented at baseline.   Assessment and Plan: 1. COVID-19 - MyChart COVID-19 home monitoring program; Future - benzonatate (TESSALON) 100 MG capsule; Take 1 capsule (100 mg total) by mouth 3 (three) times daily as needed for cough.  Dispense: 30 capsule; Refill: 0 - nirmatrelvir/ritonavir EUA (PAXLOVID) 20 x 150 MG & 10 x 100MG  TABS; Take 3 tablets by mouth 2 (two) times daily for 5 days. (Take nirmatrelvir 150 mg two tablets twice daily for 5 days and ritonavir 100 mg one tablet twice daily for 5 days) Patient GFR is 88  Dispense: 30 tablet; Refill: 0 Discussed risks/benefits of antiviral medications including most common potential ADRs. Patient voiced understanding and would like to proceed with antiviral medication. They are candidate for Paxlovid with recent normal renal function.. Rx sent to pharmacy. Supportive measures, OTC medications and vitamin regimen reviewed. Tessalon per order. Patient has been enrolled in a MyChart COVID symptom monitoring program. Samule Dry reviewed in detail. Strict ER precautions discussed with patient.    Follow Up Instructions: I discussed the assessment and treatment plan with the patient. The patient was provided an opportunity to ask questions and all were answered. The patient agreed with the plan and demonstrated an understanding of the instructions.  A copy of instructions were sent to the patient via MyChart unless otherwise noted below.   The patient was advised to call back or seek an in-person evaluation if the symptoms worsen or if the condition fails to improve as  anticipated.  Time:  I spent 10 minutes with the patient via telehealth technology discussing the above problems/concerns.    Leeanne Rio, PA-C

## 2022-07-10 LAB — COLOGUARD: COLOGUARD: NEGATIVE

## 2022-07-29 ENCOUNTER — Telehealth (INDEPENDENT_AMBULATORY_CARE_PROVIDER_SITE_OTHER): Payer: 59 | Admitting: Nurse Practitioner

## 2022-07-29 VITALS — BP 126/95 | Ht 59.0 in | Wt 191.0 lb

## 2022-07-29 DIAGNOSIS — I10 Essential (primary) hypertension: Secondary | ICD-10-CM | POA: Diagnosis not present

## 2022-07-29 DIAGNOSIS — Z6841 Body Mass Index (BMI) 40.0 and over, adult: Secondary | ICD-10-CM

## 2022-07-29 NOTE — Progress Notes (Signed)
Established Patient Office Visit  An audio/visual tele-health visit was completed today for this patient. I connected with  ADRA SHEPLER on 07/29/22 utilizing audio/visual technology and verified that I am speaking with the correct person using two identifiers. The patient was located at their home, and I was located at the office of Rio at [facility] during the encounter. I discussed the limitations of evaluation and management by telemedicine. The patient expressed understanding and agreed to proceed.     Subjective   Patient ID: Carol Dixon, female    DOB: 07-22-1976  Age: 46 y.o. MRN: [id number]  Chief Complaint  Patient presents with   Obesity    Obesity: Discussed trying Wegovy, patient has not been able to get this filled.  Was told she needs prior authorization.  Had IUD removed approximately 6 months ago, has not had period since then.  Thinks she may be postmenopausal but is not sure.  Following up with OB/GYN for this.  Has tried healthy weight and wellness center in the past, not interested in really establishing there for weight loss at this time.  Elevated blood pressure: Elevated at last office visit.  She has checked blood pressure at home periodically, at home readings are as follows 134/83, 138/89, 130/98, 143/96, 161/101, 138/89, 126/95.  Had COVID within the last 6 weeks or so, is recovering still having reduced taste and smell.    Review of Systems  Respiratory:  Negative for shortness of breath.   Cardiovascular:  Negative for chest pain.      Objective:     BP (!) 126/95   Ht 4\' 11"  (1.499 m)   Wt 191 lb (86.6 kg)   BMI 38.58 kg/m  BP Readings from Last 3 Encounters:  07/29/22 (!) 126/95  06/24/22 (!) 156/98  02/11/22 124/84   Wt Readings from Last 3 Encounters:  07/29/22 191 lb (86.6 kg)  06/24/22 191 lb 6 oz (86.8 kg)  02/11/22 190 lb (86.2 kg)      Physical Exam Comprehensive physical exam not completed today as office  visit was conducted remotely.  Appears well over video.  Patient was alert and oriented, and appeared to have appropriate judgment.   No results found for any visits on 07/29/22.    The 10-year ASCVD risk score (Arnett DK, et al., 2019) is: 3%    Assessment & Plan:   Problem List Items Addressed This Visit       Cardiovascular and Mediastinum   Hypertensive disorder - Primary    Blood pressure systolically is fairly regularly above 338, diastolic blood pressures regularly above 80.  Patient asymptomatic.  Patient is actively try to lose weight.  Had shared decision-making discussion and for now patient will monitor blood pressure at home and notify me if systolic blood pressure continues to rise especially if it becomes over 250 and if diastolic blood pressure rises over 100.  Due to patient not being on contraception and not currently yet diagnosed as being postmenopausal, would recommend trying labetalol if we need to initiate blood pressure medication.  Follow-up in 6 months, at which point if she has not had.  She will be deemed postmenopausal and we can consider other antihypertensive options if needed.        Other   Class 3 severe obesity without serious comorbidity with body mass index (BMI) of 40.0 to 44.9 in adult [facility])    Wegovy currently out of stock, due to her not being on  contraception and not yet being considered postmenopausal would recommend against starting Wegovy until deemed postmenopausal.  We will hold off on prescribing or initiating for another 6 months, if no menstrual period occurs at that point may consider represcribing.       Return in about 6 months (around 01/28/2023).    Ailene Ards, NP

## 2022-07-29 NOTE — Assessment & Plan Note (Signed)
Blood pressure systolically is fairly regularly above 838, diastolic blood pressures regularly above 80.  Patient asymptomatic.  Patient is actively try to lose weight.  Had shared decision-making discussion and for now patient will monitor blood pressure at home and notify me if systolic blood pressure continues to rise especially if it becomes over 184 and if diastolic blood pressure rises over 100.  Due to patient not being on contraception and not currently yet diagnosed as being postmenopausal, would recommend trying labetalol if we need to initiate blood pressure medication.  Follow-up in 6 months, at which point if she has not had.  She will be deemed postmenopausal and we can consider other antihypertensive options if needed.

## 2022-07-29 NOTE — Assessment & Plan Note (Signed)
Carol Dixon currently out of stock, due to her not being on contraception and not yet being considered postmenopausal would recommend against starting Wegovy until deemed postmenopausal.  We will hold off on prescribing or initiating for another 6 months, if no menstrual period occurs at that point may consider represcribing.

## 2022-08-04 IMAGING — MG MM DIGITAL SCREENING BILAT W/ TOMO AND CAD
6 of 12 series · 6 of 36 positions shown · non-contrast
Comparison: Previous exam(s).

CLINICAL DATA: Screening.

EXAM:
DIGITAL SCREENING BILATERAL MAMMOGRAM WITH TOMOSYNTHESIS AND CAD
TECHNIQUE: Bilateral screening digital craniocaudal and mediolateral oblique
mammograms were obtained. Bilateral screening digital breast
tomosynthesis was performed. The images were evaluated with
computer-aided detection.

[L CC synth-2D]
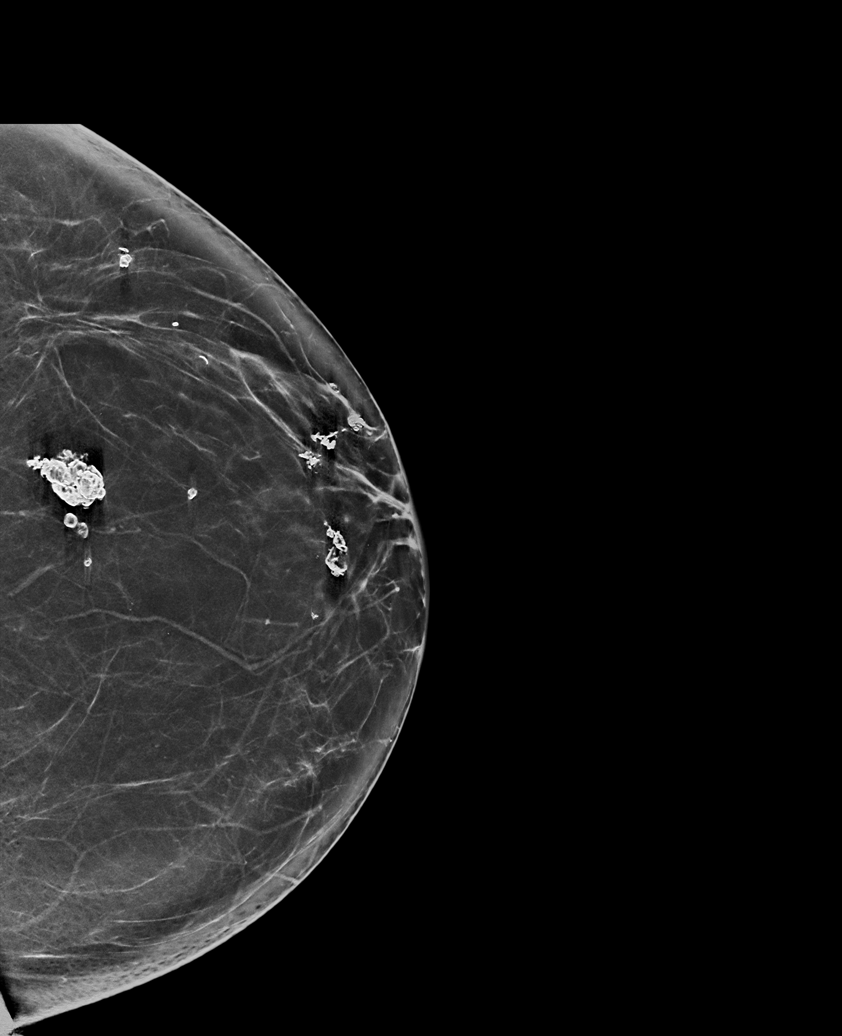

[L MLO synth-2D (1 of 2)]
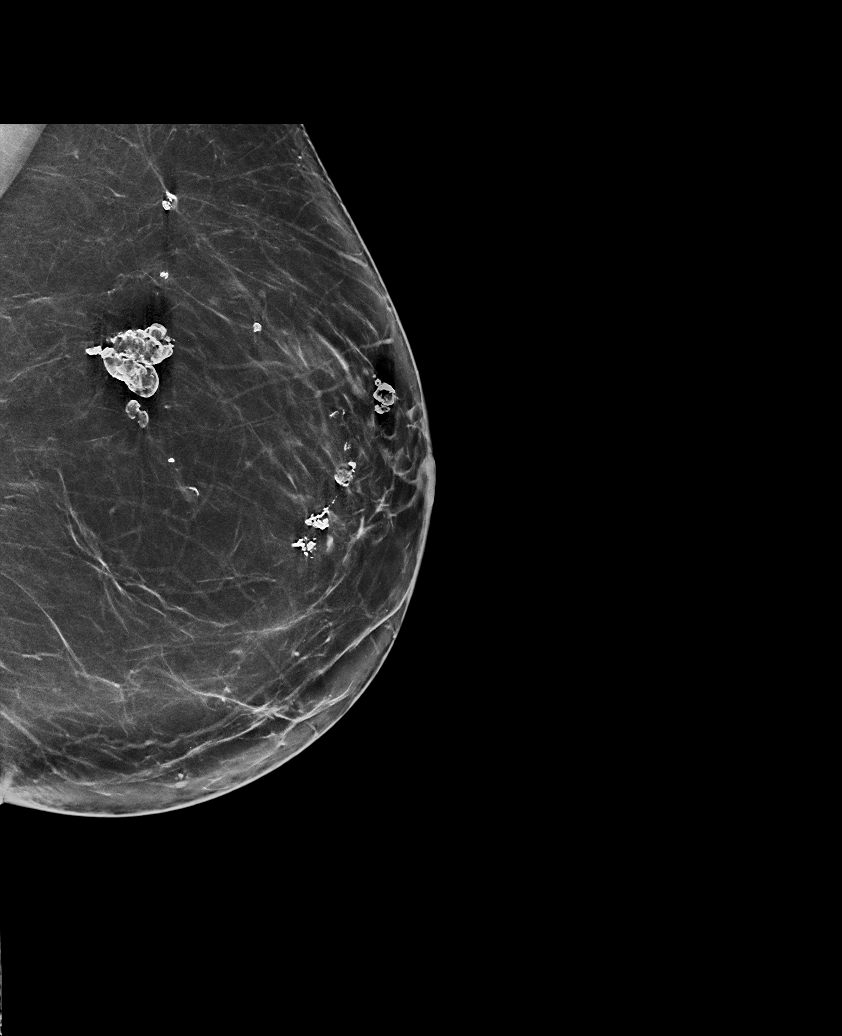

[R MLO synth-2D (1 of 2)]
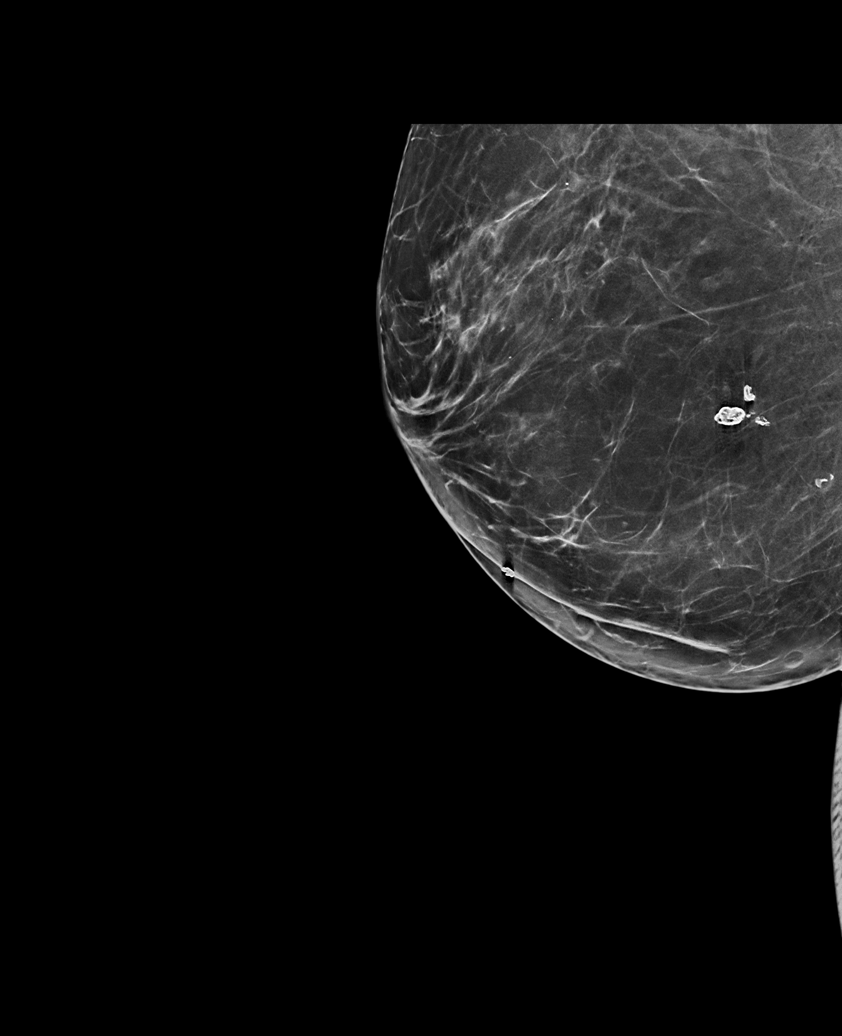

[R MLO synth-2D (2 of 2)]
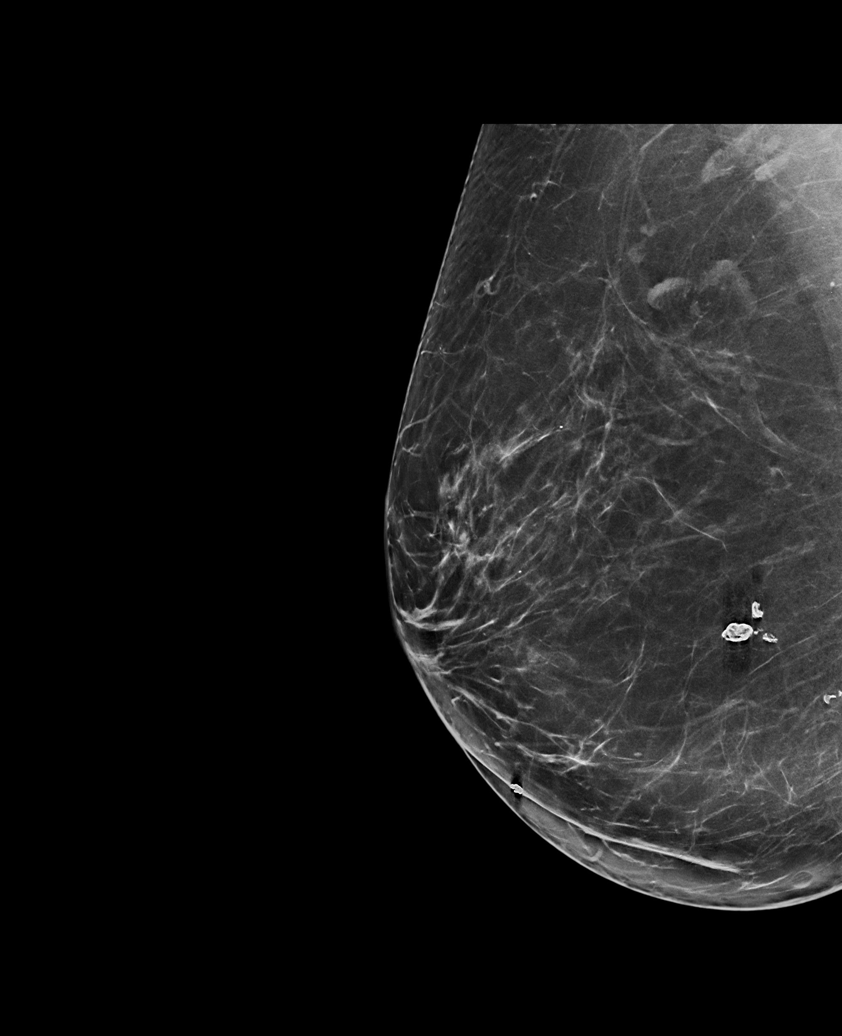

[R CC synth-2D]
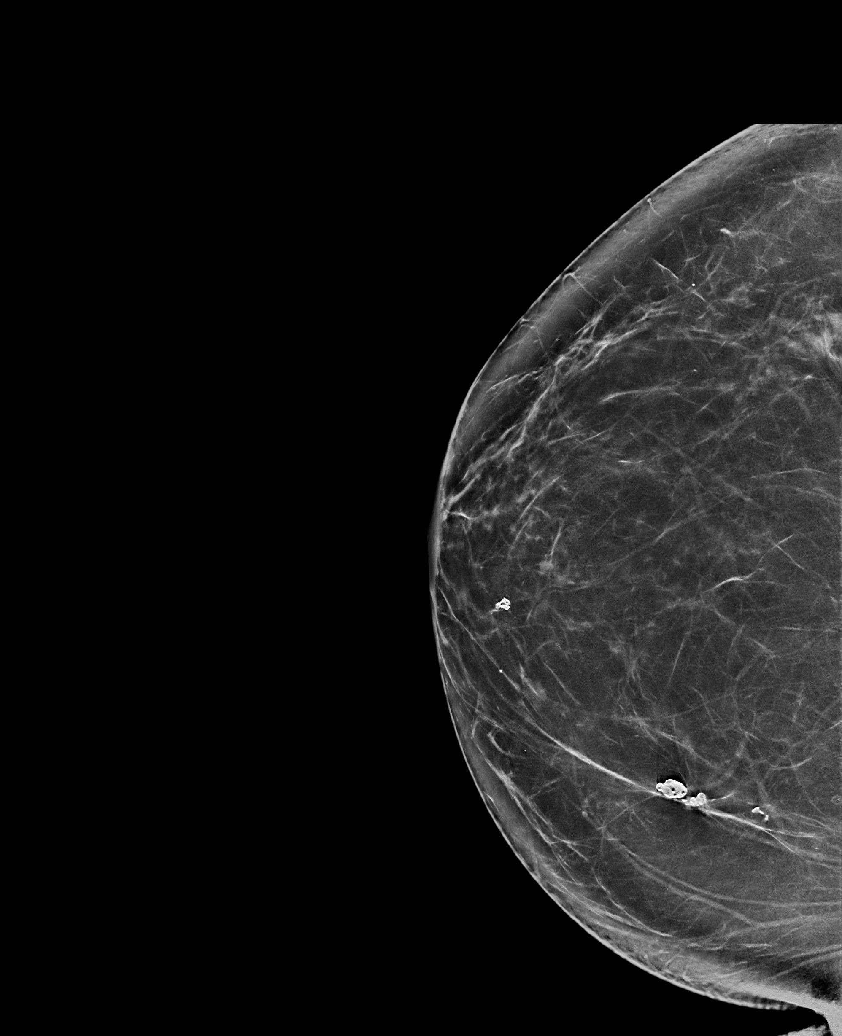

[L MLO synth-2D (2 of 2)]
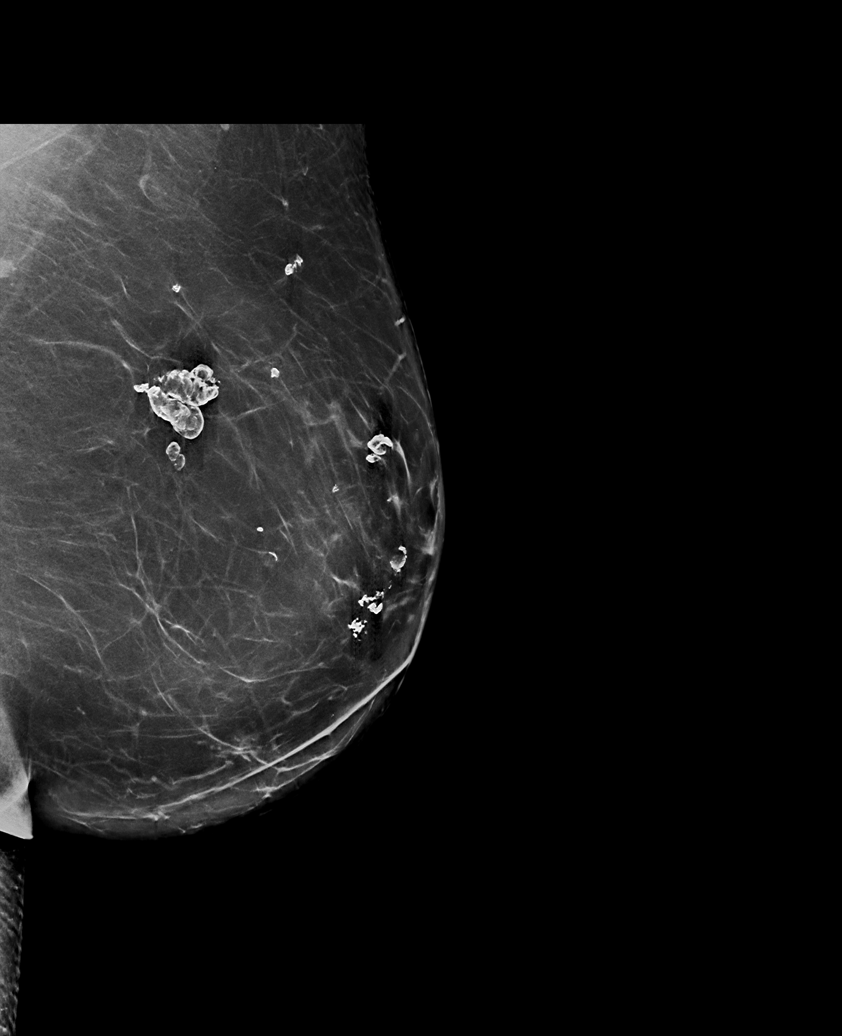

[6 of 36 positions shown; findings below may reference images not displayed]

ACR Breast Density Category b: There are scattered areas of
fibroglandular density.
FINDINGS: There are no findings suspicious for malignancy.
IMPRESSION: No mammographic evidence of malignancy. A result letter of this
screening mammogram will be mailed directly to the patient.

RECOMMENDATION:
Screening mammogram in one year. (Code:51-O-LD2)

BI-RADS CATEGORY  1: Negative.

## 2022-08-17 ENCOUNTER — Telehealth: Payer: Self-pay | Admitting: *Deleted

## 2022-08-17 NOTE — Telephone Encounter (Signed)
Start PA-WEGOVY 0.25MG /0.5ML AUTO-INJECTORS- awaiting for approval.  Key: [id number]

## 2022-08-17 NOTE — Telephone Encounter (Signed)
Notified pt--Wegovy 0.25 mg/0.87ml approved.

## 2022-11-07 ENCOUNTER — Ambulatory Visit
Admission: RE | Admit: 2022-11-07 | Discharge: 2022-11-07 | Disposition: A | Discharge status: Home / Self Care | Payer: 59 | Source: Ambulatory Visit | Attending: Family Medicine | Admitting: Family Medicine

## 2022-11-07 VITALS — BP 129/80 | HR 111 | Temp 97.8°F | Resp 20

## 2022-11-07 DIAGNOSIS — Z1152 Encounter for screening for COVID-19: Secondary | ICD-10-CM | POA: Diagnosis not present

## 2022-11-07 DIAGNOSIS — J069 Acute upper respiratory infection, unspecified: Secondary | ICD-10-CM | POA: Diagnosis not present

## 2022-11-07 DIAGNOSIS — R059 Cough, unspecified: Secondary | ICD-10-CM | POA: Diagnosis not present

## 2022-11-07 MED ORDER — PREDNISONE 20 MG PO TABS: 40.0000 mg | ORAL_TABLET | Freq: Every day | ORAL | 0 refills | Status: DC

## 2022-11-07 MED ORDER — FLUTICASONE PROPIONATE 50 MCG/ACT NA SUSP: 1.0000 | Freq: Two times a day (BID) | NASAL | 2 refills | Status: DC

## 2022-11-07 MED ORDER — PROMETHAZINE-DM 6.25-15 MG/5ML PO SYRP: 5.0000 mL | ORAL_SOLUTION | Freq: Four times a day (QID) | ORAL | 0 refills | Status: DC | PRN

## 2022-11-07 NOTE — ED Triage Notes (Signed)
Pt reports cough, bilateral ear pain with right ear hurting worse, heavy breathing, congestion, jaw and teeth hurt, headache x 4 days

## 2022-11-07 NOTE — ED Provider Notes (Signed)
[facility]    CSN: [id number] Arrival date & time: 11/07/22  1309      History   Chief Complaint Chief Complaint  Patient presents with   Cough    HPI Carol Dixon is a 47 y.o. female.   Pt reports cough, bilateral ear pain with right ear hurting worse, heavy breathing, congestion, jaw and teeth hurt, headache x 4 days       Past Medical History:  Diagnosis Date   Breast hypertrophy 09/2014   Dental crown present 09/2014   GERD (gastroesophageal reflux disease)    History of gastric ulcer    Hypertension     Patient Active Problem List   Diagnosis Date Noted   Need for vaccination 06/24/2022   Colon cancer screening 06/24/2022   Hypertensive disorder 01/17/2022   Class 3 severe obesity without serious comorbidity with body mass index (BMI) of 40.0 to 44.9 in adult (HCC) 12/10/2021   Hyperlipidemia 12/10/2021   Intermittent chest pain 12/10/2021   Allergic contact dermatitis due to plants, except food 03/24/2021   Tobacco use 08/14/2018   Low HDL (under 40) 10/12/2017   Prediabetes 10/12/2017   Abnormal first trimester screen 09/30/2011   Elderly primigravida, antepartum 09/30/2011    Past Surgical History:  Procedure Laterality Date   BREAST REDUCTION SURGERY Bilateral 10/14/2014   Procedure: BILATERAL MAMMARY REDUCTION  (BREAST);  Surgeon: Karie Fetch, MD;  [facility];  Service: Plastics;  Laterality: Bilateral;   CESAREAN SECTION  02/20/2012   Procedure: CESAREAN SECTION;  Surgeon: Jeani Hawking, MD;  Location: [facility];  Service: Gynecology;  Laterality: N/A;   REDUCTION MAMMAPLASTY     TONSILLECTOMY  10/17/1994   UPPER GASTROINTESTINAL ENDOSCOPY      OB History     Gravida  1   Para  1   Term  0   Preterm  1   AB  0   Living  1      SAB  0   IAB  0   Ectopic  0   Multiple  0   Live Births  1            Home Medications    Prior to Admission medications   Medication Sig  Start Date End Date Taking? Authorizing Provider  fluticasone (FLONASE) 50 MCG/ACT nasal spray Place 1 spray into both nostrils 2 (two) times daily. 11/07/22  Yes Particia Nearing, PA-C  predniSONE (DELTASONE) 20 MG tablet Take 2 tablets (40 mg total) by mouth daily with breakfast. 11/07/22  Yes Particia Nearing, PA-C  promethazine-dextromethorphan (PROMETHAZINE-DM) 6.25-15 MG/5ML syrup Take 5 mLs by mouth 4 (four) times daily as needed. 11/07/22  Yes Particia Nearing, PA-C    Family History Family History  Problem Relation Age of Onset   Hypertension Mother    Hypertension Father    Hyperlipidemia Father    Stroke Maternal Aunt    Stroke Maternal Uncle    Diabetes Paternal Aunt    Hyperlipidemia Paternal Aunt    Cancer Paternal Uncle    COPD Maternal Grandmother    Cancer Paternal Grandmother    Cancer Paternal Grandfather     Social History Social History   Tobacco Use   Smoking status: Every Day    Packs/day: 0.50    Years: 20.00    Total pack years: 10.00    Types: Cigarettes   Smokeless tobacco: Never  Substance Use Topics   Alcohol use: No  Drug use: No     Allergies   Patient has no known allergies.   Review of Systems Review of Systems PER HPI  Physical Exam Triage Vital Signs ED Triage Vitals  Enc Vitals Group     BP 11/07/22 1343 129/80     Pulse Rate 11/07/22 1343 (!) 111     Resp 11/07/22 1343 20     Temp 11/07/22 1343 97.8 F (36.6 C)     Temp Source 11/07/22 1343 Oral     SpO2 11/07/22 1343 95 %     Weight --      Height --      Head Circumference --      Peak Flow --      Pain Score 11/07/22 1344 5     Pain Loc --      Pain Edu? --      Excl. in [place]? --    No data found.  Updated Vital Signs BP 129/80 (BP Location: Right Arm)   Pulse (!) 111   Temp 97.8 F (36.6 C) (Oral)   Resp 20   SpO2 95%   Visual Acuity Right Eye Distance:   Left Eye Distance:   Bilateral Distance:    Right Eye Near:   Left Eye Near:     Bilateral Near:     Physical Exam Vitals and nursing note reviewed.  Constitutional:      Appearance: Normal appearance.  HENT:     Head: Atraumatic.     Right Ear: External ear normal.     Left Ear: External ear normal.     Ears:     Comments: Bilateral middle ear effusions    Nose: Congestion present.     Mouth/Throat:     Mouth: Mucous membranes are moist.     Pharynx: Posterior oropharyngeal erythema present.  Eyes:     Extraocular Movements: Extraocular movements intact.     Conjunctiva/sclera: Conjunctivae normal.  Cardiovascular:     Rate and Rhythm: Normal rate and regular rhythm.     Heart sounds: Normal heart sounds.  Pulmonary:     Effort: Pulmonary effort is normal.     Breath sounds: Normal breath sounds. No wheezing or rales.  Musculoskeletal:        General: Normal range of motion.     Cervical back: Normal range of motion and neck supple.  Skin:    General: Skin is warm and dry.  Neurological:     Mental Status: She is alert and oriented to person, place, and time.  Psychiatric:        Mood and Affect: Mood normal.        Thought Content: Thought content normal.    UC Treatments / Results  Labs (all labs ordered are listed, but only abnormal results are displayed) Labs Reviewed  SARS CORONAVIRUS 2 (TAT 6-24 HRS)   EKG  Radiology No results found.  Procedures Procedures (including critical care time)  Medications Ordered in UC Medications - No data to display  Initial Impression / Assessment and Plan / UC Course  I have reviewed the triage vital signs and the nursing notes.  Pertinent labs & imaging results that were available during my care of the patient were reviewed by me and considered in my medical decision making (see chart for details).     Vitals and exam overall reassuring and suggestive of a viral respiratory infection.  COVID testing pending, will treat with a very short burst of prednisone given  extent of cough and middle  ear effusion, Phenergan DM, Flonase, supportive over-the-counter medications and home care.  Return for worsening symptoms. Final Clinical Impressions(s) / UC Diagnoses   Final diagnoses:  Viral URI with cough   Discharge Instructions   None    ED Prescriptions     Medication Sig Dispense Auth. Provider   predniSONE (DELTASONE) 20 MG tablet Take 2 tablets (40 mg total) by mouth daily with breakfast. 6 tablet Volney American, PA-C   fluticasone [facility]) 50 MCG/ACT nasal spray Place 1 spray into both nostrils 2 (two) times daily. [place], [place]   promethazine-dextromethorphan (PROMETHAZINE-DM) 6.25-15 MG/5ML syrup Take 5 mLs by mouth 4 (four) times daily as needed. 100 mL Volney American, [place]      PDMP not reviewed this encounter.   Volney American, [place] 11/07/22 1404

## 2022-11-08 LAB — SARS CORONAVIRUS 2 (TAT 6-24 HRS): SARS Coronavirus 2: NEGATIVE

## 2022-12-24 ENCOUNTER — Ambulatory Visit
Admission: EM | Admit: 2022-12-24 | Discharge: 2022-12-24 | Disposition: A | Discharge status: Home / Self Care | Payer: 59 | Attending: Family Medicine | Admitting: Family Medicine

## 2022-12-24 DIAGNOSIS — J3089 Other allergic rhinitis: Secondary | ICD-10-CM

## 2022-12-24 DIAGNOSIS — M791 Myalgia, unspecified site: Secondary | ICD-10-CM | POA: Diagnosis not present

## 2022-12-24 DIAGNOSIS — M2540 Effusion, unspecified joint: Secondary | ICD-10-CM | POA: Diagnosis not present

## 2022-12-24 DIAGNOSIS — R21 Rash and other nonspecific skin eruption: Secondary | ICD-10-CM | POA: Diagnosis not present

## 2022-12-24 DIAGNOSIS — J209 Acute bronchitis, unspecified: Secondary | ICD-10-CM | POA: Diagnosis not present

## 2022-12-24 MED ORDER — FLUTICASONE PROPIONATE 50 MCG/ACT NA SUSP: 1.0000 | Freq: Two times a day (BID) | NASAL | 2 refills | Status: DC

## 2022-12-24 MED ORDER — METHYLPREDNISOLONE SODIUM SUCC 125 MG IJ SOLR
80.0000 mg | Freq: Once | INTRAMUSCULAR | Status: AC
  Administered 2022-12-24: 80 mg via INTRAMUSCULAR

## 2022-12-24 MED ORDER — CETIRIZINE HCL 10 MG PO TABS: 10.0000 mg | ORAL_TABLET | Freq: Every day | ORAL | 2 refills | Status: DC

## 2022-12-24 NOTE — Discharge Instructions (Signed)
We have sent some labs out today that should be back tomorrow to further evaluate your joint pain, joint swelling and rash.  We have also given you a steroid shot today to help with the inflammation as well as your ongoing cough and congestion.  I think these are largely allergic in nature so start allergy regimen with Zyrtec and Flonase.  Continue supportive medications over-the-counter as well as needed.  Follow-up with your primary care provider within the next week for a recheck.

## 2022-12-24 NOTE — ED Triage Notes (Signed)
Pt reports she has "coughing fits" at night, wheezing sounds, a rash all over her body, swollen left hand, feet, and legs, muscle pains x 5 days. Took ibuprofen for relief.   Pt says she never fully got better since the last time she was here in January.   Son had a rash and the dermatologist called it the "fifths disease"

## 2022-12-24 NOTE — ED Notes (Signed)
Pt became pale and diaphoretic during venipuncture. Pt assisted to lying position in chair. Pt remained alert. O2 95%. Ice pack provided.   Pt color returning, pt alert. PA at bedside.

## 2022-12-24 NOTE — ED Provider Notes (Signed)
[facility]    CSN: [id number] Arrival date & time: 12/24/22  0913      History   Chief Complaint No chief complaint on file.   HPI Carol Dixon is a 47 y.o. female.   Presenting today with 5-day history of diffuse rash, joint pains, joint swelling worse in the hands feet and legs, myalgias.  States her son had a similar rash and was seen at dermatology and diagnosed with fifth disease.  She states the rash has gotten slightly better over the course of the past day or so but the swelling and body aches are worsening.  Denies known fever, recent tick bites, recent travel outside the country, new medications or exposures.  Not trying anything over-the-counter for the symptoms.  She also states that her cough and congestion have never fully resolved since she was here 2 months ago.  She takes NyQuil over the weekends to help but does not find relief with this.  She was treated with a very short course of prednisone, cough syrup and was told to take nasal spray and allergy medication but has not been taking the allergy medication and states the prednisone only helped for short period of time.  No known history of chronic pulmonary disease.    Past Medical History:  Diagnosis Date   Breast hypertrophy 09/2014   Dental crown present 09/2014   GERD (gastroesophageal reflux disease)    History of gastric ulcer    Hypertension     Patient Active Problem List   Diagnosis Date Noted   Need for vaccination 06/24/2022   Colon cancer screening 06/24/2022   Hypertensive disorder 01/17/2022   Class 3 severe obesity without serious comorbidity with body mass index (BMI) of 40.0 to 44.9 in adult ([place]) 12/10/2021   Hyperlipidemia 12/10/2021   Intermittent chest pain 12/10/2021   Allergic contact dermatitis due to plants, except food 03/24/2021   Tobacco use 08/14/2018   Low HDL (under 40) 10/12/2017   Prediabetes 10/12/2017   Abnormal first trimester screen 09/30/2011   Elderly  primigravida, antepartum 09/30/2011    Past Surgical History:  Procedure Laterality Date   BREAST REDUCTION SURGERY Bilateral 10/14/2014   Procedure: BILATERAL MAMMARY REDUCTION  (BREAST);  Surgeon: Charlene Brooke, MD;  Location: [facility];  Service: Plastics;  Laterality: Bilateral;   CESAREAN SECTION  02/20/2012   Procedure: CESAREAN SECTION;  Surgeon: Cyril Mourning, MD;  Location: [facility];  Service: Gynecology;  Laterality: N/A;   REDUCTION MAMMAPLASTY     TONSILLECTOMY  10/17/1994   UPPER GASTROINTESTINAL ENDOSCOPY      OB History     Gravida  1   Para  1   Term  0   Preterm  1   AB  0   Living  1      SAB  0   IAB  0   Ectopic  0   Multiple  0   Live Births  1            Home Medications    Prior to Admission medications   Medication Sig Start Date End Date Taking? Authorizing Provider  cetirizine (ZYRTEC ALLERGY) 10 MG tablet Take 1 tablet (10 mg total) by mouth daily. 12/24/22  Yes Volney American, PA-C  fluticasone [facility]) 50 MCG/ACT nasal spray Place 1 spray into both nostrils 2 (two) times daily. 12/24/22  Yes Volney American, PA-C  fluticasone [facility]) 50 MCG/ACT nasal spray Place 1 spray into  both nostrils 2 (two) times daily. 11/07/22   Volney American, PA-C  predniSONE (DELTASONE) 20 MG tablet Take 2 tablets (40 mg total) by mouth daily with breakfast. 11/07/22   Volney American, PA-C  promethazine-dextromethorphan (PROMETHAZINE-DM) 6.25-15 MG/5ML syrup Take 5 mLs by mouth 4 (four) times daily as needed. 11/07/22   Volney American, PA-C    Family History Family History  Problem Relation Age of Onset   Hypertension Mother    Hypertension Father    Hyperlipidemia Father    Stroke Maternal Aunt    Stroke Maternal Uncle    Diabetes Paternal Aunt    Hyperlipidemia Paternal Aunt    Cancer Paternal Uncle    COPD Maternal Grandmother    Cancer Paternal Grandmother    Cancer Paternal  Grandfather     Social History Social History   Tobacco Use   Smoking status: Every Day    Packs/day: 0.50    Years: 20.00    Total pack years: 10.00    Types: Cigarettes   Smokeless tobacco: Never  Substance Use Topics   Alcohol use: No   Drug use: No     Allergies   Patient has no known allergies.   Review of Systems Review of Systems PER HPI  Physical Exam Triage Vital Signs ED Triage Vitals  Enc Vitals Group     BP 12/24/22 0924 (!) 139/95     Pulse Rate 12/24/22 0924 91     Resp 12/24/22 0924 20     Temp 12/24/22 0924 98.2 F (36.8 C)     Temp Source 12/24/22 0924 Oral     SpO2 12/24/22 0924 97 %     Weight --      Height --      Head Circumference --      Peak Flow --      Pain Score 12/24/22 0931 6     Pain Loc --      Pain Edu? --      Excl. in [place]? --    No data found.  Updated Vital Signs BP (!) 139/95 (BP Location: Right Arm)   Pulse 91   Temp 98.2 F (36.8 C) (Oral)   Resp 20   SpO2 97%   Visual Acuity Right Eye Distance:   Left Eye Distance:   Bilateral Distance:    Right Eye Near:   Left Eye Near:    Bilateral Near:     Physical Exam Vitals and nursing note reviewed.  Constitutional:      Appearance: Normal appearance.  HENT:     Head: Atraumatic.     Right Ear: Tympanic membrane and external ear normal.     Left Ear: Tympanic membrane and external ear normal.     Nose: Rhinorrhea present.     Mouth/Throat:     Mouth: Mucous membranes are moist.     Pharynx: Posterior oropharyngeal erythema present.  Eyes:     Extraocular Movements: Extraocular movements intact.     Conjunctiva/sclera: Conjunctivae normal.  Cardiovascular:     Rate and Rhythm: Normal rate and regular rhythm.     Heart sounds: Normal heart sounds.  Pulmonary:     Effort: Pulmonary effort is normal.     Breath sounds: Normal breath sounds. No wheezing or rales.  Musculoskeletal:        General: Swelling present. Normal range of motion.     Cervical  back: Normal range of motion and neck supple.  Comments: Bilateral hands, feet and legs diffusely edematous symmetrically  Skin:    General: Skin is warm and dry.     Comments: No significant appreciable rash at this time  Neurological:     Mental Status: She is alert and oriented to person, place, and time.     Comments: All 4 extremities neurovascularly intact  Psychiatric:        Mood and Affect: Mood normal.        Thought Content: Thought content normal.      UC Treatments / Results  Labs (all labs ordered are listed, but only abnormal results are displayed) Labs Reviewed  CBC  COMPREHENSIVE METABOLIC PANEL  LYME DISEASE SEROLOGY W/REFLEX  ROCKY MTN SPOTTED FVR ABS PNL(IGG+IGM)  SEDIMENTATION RATE    EKG   Radiology No results found.  Procedures Procedures (including critical care time)  Medications Ordered in UC Medications  methylPREDNISolone sodium succinate (SOLU-MEDROL) 125 mg/2 mL injection 80 mg (has no administration in time range)    Initial Impression / Assessment and Plan / UC Course  I have reviewed the triage vital signs and the nursing notes.  Pertinent labs & imaging results that were available during my care of the patient were reviewed by me and considered in my medical decision making (see chart for details).     Suspect an ongoing bronchitis/seasonal allergy exacerbation type issue causing her ongoing congestion and cough.  Discussed importance of consistent allergy regimen, will also give IM Solu-Medrol both for the rash and to help with the bronchitis symptoms at this time.  Regarding her rash, joint swelling, body aches discussed that this could be numerous things causing this.  She is agreeable to obtaining some labs for further evaluation and rule out and following up with primary care next week for a recheck.  Hopefully the Solu-Medrol will help with some of the inflammation in addition to avoiding high salt intake, drinking plenty of  fluids.  Return for any worsening symptoms.  Final Clinical Impressions(s) / UC Diagnoses   Final diagnoses:  Joint swelling  Myalgia  Rash  Acute bronchitis, unspecified organism  Seasonal allergic rhinitis due to other allergic trigger     Discharge Instructions      We have sent some labs out today that should be back tomorrow to further evaluate your joint pain, joint swelling and rash.  We have also given you a steroid shot today to help with the inflammation as well as your ongoing cough and congestion.  I think these are largely allergic in nature so start allergy regimen with Zyrtec and Flonase.  Continue supportive medications over-the-counter as well as needed.  Follow-up with your primary care provider within the next week for a recheck.    ED Prescriptions     Medication Sig Dispense Auth. Provider   cetirizine (ZYRTEC ALLERGY) 10 MG tablet Take 1 tablet (10 mg total) by mouth daily. 30 tablet Volney American, PA-C   fluticasone [facility]) 50 MCG/ACT nasal spray Place 1 spray into both nostrils 2 (two) times daily. 16 g Volney American, [place]      PDMP not reviewed this encounter.   Volney American, [place] 12/24/22 1009

## 2022-12-24 NOTE — ED Notes (Signed)
Pt ambulated out of UC with steady gait. Alert and oriented. Denies any dizziness. PA aware and reported to continue with discharge.

## 2022-12-26 LAB — COMPREHENSIVE METABOLIC PANEL
ALT: 49 IU/L — ABNORMAL HIGH (ref 0–32)
AST: 27 IU/L (ref 0–40)
Albumin/Globulin Ratio: 1.8 (ref 1.2–2.2)
Albumin: 4.4 g/dL (ref 3.9–4.9)
Alkaline Phosphatase: 110 IU/L (ref 44–121)
BUN/Creatinine Ratio: 27 — ABNORMAL HIGH (ref 9–23)
BUN: 16 mg/dL (ref 6–24)
Bilirubin Total: <0.2 mg/dL (ref 0.0–1.2)
CO2: 18 mmol/L — ABNORMAL LOW (ref 20–29)
Calcium: 9 mg/dL (ref 8.7–10.2)
Chloride: 107 mmol/L — ABNORMAL HIGH (ref 96–106)
Creatinine, Ser: 0.6 mg/dL (ref 0.57–1.00)
Globulin, Total: 2.4 g/dL (ref 1.5–4.5)
Glucose: 84 mg/dL (ref 70–99)
Potassium: 4 mmol/L (ref 3.5–5.2)
Sodium: 144 mmol/L (ref 134–144)
Total Protein: 6.8 g/dL (ref 6.0–8.5)
eGFR: 113 mL/min/{1.73_m2} (ref >59)

## 2022-12-26 LAB — CBC
Hematocrit: 39.5 % (ref 34.0–46.6)
Hemoglobin: 13 g/dL (ref 11.1–15.9)
MCH: 28.8 pg (ref 26.6–33.0)
MCHC: 32.9 g/dL (ref 31.5–35.7)
MCV: 88 fL (ref 79–97)
Platelets: 322 10*3/uL (ref 150–450)
RBC: 4.51 x10E6/uL (ref 3.77–5.28)
RDW: 13.8 % (ref 11.7–15.4)
WBC: 4.8 10*3/uL (ref 3.4–10.8)

## 2022-12-26 LAB — SEDIMENTATION RATE: Sed Rate: 38 mm/hr — ABNORMAL HIGH (ref 0–32)

## 2022-12-26 LAB — LYME DISEASE SEROLOGY W/REFLEX: Lyme Total Antibody EIA: NEGATIVE

## 2023-02-10 ENCOUNTER — Other Ambulatory Visit: Payer: Self-pay | Admitting: Nurse Practitioner

## 2023-02-10 ENCOUNTER — Ambulatory Visit (INDEPENDENT_AMBULATORY_CARE_PROVIDER_SITE_OTHER): Payer: 59 | Admitting: Nurse Practitioner

## 2023-02-10 VITALS — BP 114/86 | HR 85 | Temp 98.0°F | Ht 59.0 in | Wt 196.0 lb

## 2023-02-10 DIAGNOSIS — Z1239 Encounter for other screening for malignant neoplasm of breast: Secondary | ICD-10-CM | POA: Diagnosis not present

## 2023-02-10 DIAGNOSIS — N912 Amenorrhea, unspecified: Secondary | ICD-10-CM | POA: Insufficient documentation

## 2023-02-10 DIAGNOSIS — N6323 Unspecified lump in the left breast, lower outer quadrant: Secondary | ICD-10-CM | POA: Diagnosis not present

## 2023-02-10 DIAGNOSIS — E669 Obesity, unspecified: Secondary | ICD-10-CM | POA: Insufficient documentation

## 2023-02-10 DIAGNOSIS — Z6839 Body mass index (BMI) 39.0-39.9, adult: Secondary | ICD-10-CM

## 2023-02-10 NOTE — Assessment & Plan Note (Signed)
Diagnostic mammogram and ultrasound left breast ordered today, further recommendations may be made based upon his results

## 2023-02-10 NOTE — Progress Notes (Signed)
Established Patient Office Visit  Subjective   Patient ID: Carol Dixon, female    DOB: [birth year]  Age: 47 y.o. MRN: [id number]  Chief Complaint  Patient presents with   Medical Management of Chronic Issues    Wants to talk about long pause of period ( 12 month)  Lump on the left breast    Left breast mass: Noticed this today, nontender.  No change to skin, no nipple discharge, no nipple inversion. Obesity: Would like to discuss pharmacological therapy for weight loss.  Reports she is been unsuccessful with sustained weight loss over the years.  Has been able to lose significant amount of weight with calorie deficit diet, but has been unable to sustain this.  Recently she has been working on diet as well as increasing her physical activity without weight loss.  Has not tried pharmacological treatment as of yet.  Has previously denied personal or family history of thyroid cancer, denies a personal history of pancreatitis.  Reports she has been without a period for total of 12 months, had IUD removed about 1 year ago.  Has not seen OB/GYN for follow-up.     ROS: see HPI    Objective:     BP 114/86   Pulse 85   Temp 98 F (36.7 C) (Temporal)   Ht 4\' 11"  (1.499 m)   Wt 196 lb (88.9 kg)   SpO2 96%   BMI 39.59 kg/m  BP Readings from Last 3 Encounters:  02/10/23 114/86  12/24/22 (!) 139/95  11/07/22 129/80   Wt Readings from Last 3 Encounters:  02/10/23 196 lb (88.9 kg)  07/29/22 191 lb (86.6 kg)  06/24/22 191 lb 6 oz (86.8 kg)      Physical Exam Constitutional:      Appearance: She is obese.  Pulmonary:     Effort: Pulmonary effort is normal.  Chest:    Musculoskeletal:     Cervical back: Neck supple.  Neurological:     General: No focal deficit present.     Mental Status: She is alert and oriented to person, place, and time.  Psychiatric:        Mood and Affect: Mood normal.        Behavior: Behavior normal.        Thought Content: Thought content normal.         Judgment: Judgment normal.      No results found for any visits on 02/10/23.    The 10-year ASCVD risk score (Arnett DK, et al., 2019) is: 2.6%    Assessment & Plan:   Problem List Items Addressed This Visit       Other   Mass of lower outer quadrant of left breast - Primary    Diagnostic mammogram and ultrasound left breast ordered today, further recommendations may be made based upon his results      Relevant Orders   MM 3D DIAGNOSTIC MAMMOGRAM UNILATERAL LEFT BREAST   MM 3D SCREENING MAMMOGRAM UNILATERAL RIGHT BREAST   US BREAST COMPLETE UNI LEFT INC AXILLA   CBC   Comprehensive metabolic panel   Hemoglobin A1c   Lipid panel   TSH   Estrogens, total   Progesterone   Testosterone   Luteinizing hormone   Follicle stimulating hormone   Estradiol   Encounter for screening for malignant neoplasm of breast    Screening mammogram of right breast ordered today      Relevant Orders   MM 3D DIAGNOSTIC MAMMOGRAM UNILATERAL LEFT  BREAST   MM 3D SCREENING MAMMOGRAM UNILATERAL RIGHT BREAST   US BREAST COMPLETE UNI LEFT INC AXILLA   CBC   Comprehensive metabolic panel   Hemoglobin A1c   Lipid panel   TSH   Estrogens, total   Progesterone   Testosterone   Luteinizing hormone   Follicle stimulating hormone   Amenorrhea    Chronic, reports 12 months without having had a period Possibly secondary to menopause or PCOS, labs ordered for further evaluation.  Further recommendations may be made based upon the results.      Relevant Orders   CBC   Comprehensive metabolic panel   Hemoglobin A1c   Lipid panel   TSH   Estrogens, total   Progesterone   Testosterone   Luteinizing hormone   Follicle stimulating hormone   Estradiol   Obesity    Labs ordered, we had extensive discussion regarding treatment options.  Patient would like to try GLP-1 agonist if possible.  Labs ordered today, if undetectable estrogen and progesterone noted on labs will consider patient  postmenopausal as she has not had a period in 12 months and consider initiation of GLP-1 agonist.      Relevant Orders   CBC   Comprehensive metabolic panel   Hemoglobin A1c   Lipid panel   TSH   Estrogens, total   Progesterone   Testosterone   Luteinizing hormone   Follicle stimulating hormone   Estradiol    Return as scheeduled.    Elenore Paddy, NP

## 2023-02-10 NOTE — Assessment & Plan Note (Signed)
Screening mammogram of right breast ordered today

## 2023-02-10 NOTE — Assessment & Plan Note (Signed)
Chronic, reports 12 months without having had a period Possibly secondary to menopause or PCOS, labs ordered for further evaluation.  Further recommendations may be made based upon the results.

## 2023-02-10 NOTE — Assessment & Plan Note (Signed)
Labs ordered, we had extensive discussion regarding treatment options.  Patient would like to try GLP-1 agonist if possible.  Labs ordered today, if undetectable estrogen and progesterone noted on labs will consider patient postmenopausal as she has not had a period in 12 months and consider initiation of GLP-1 agonist.

## 2023-02-14 ENCOUNTER — Other Ambulatory Visit (HOSPITAL_COMMUNITY): Payer: Self-pay | Admitting: Nurse Practitioner

## 2023-02-14 DIAGNOSIS — N6320 Unspecified lump in the left breast, unspecified quadrant: Secondary | ICD-10-CM

## 2023-02-16 LAB — COMPREHENSIVE METABOLIC PANEL
ALT: 19 U/L (ref 0–35)
AST: 18 U/L (ref 0–37)
Albumin: 4.6 g/dL (ref 3.5–5.2)
Alkaline Phosphatase: 77 U/L (ref 39–117)
BUN: 16 mg/dL (ref 6–23)
CO2: 23 mEq/L (ref 19–32)
Calcium: 9.5 mg/dL (ref 8.4–10.5)
Chloride: 105 mEq/L (ref 96–112)
Creatinine, Ser: 0.76 mg/dL (ref 0.40–1.20)
GFR: 94.2 mL/min (ref >60.00)
Glucose, Bld: 101 mg/dL — ABNORMAL HIGH (ref 70–99)
Potassium: 4 mEq/L (ref 3.5–5.1)
Sodium: 138 mEq/L (ref 135–145)
Total Bilirubin: 0.4 mg/dL (ref 0.2–1.2)
Total Protein: 7.5 g/dL (ref 6.0–8.3)

## 2023-02-16 LAB — CBC
HCT: 42.3 % (ref 36.0–46.0)
Hemoglobin: 14.3 g/dL (ref 12.0–15.0)
MCHC: 33.9 g/dL (ref 30.0–36.0)
MCV: 87.4 fl (ref 78.0–100.0)
Platelets: 308 10*3/uL (ref 150.0–400.0)
RBC: 4.84 Mil/uL (ref 3.87–5.11)
RDW: 14.4 % (ref 11.5–15.5)
WBC: 6.5 10*3/uL (ref 4.0–10.5)

## 2023-02-16 LAB — LIPID PANEL
Cholesterol: 205 mg/dL — ABNORMAL HIGH (ref 0–200)
HDL: 44.9 mg/dL (ref >39.00)
LDL Cholesterol: 140 mg/dL — ABNORMAL HIGH (ref 0–99)
NonHDL: 159.71
Total CHOL/HDL Ratio: 5
Triglycerides: 98 mg/dL (ref 0.0–149.0)
VLDL: 19.6 mg/dL (ref 0.0–40.0)

## 2023-02-16 LAB — TSH: TSH: 3.47 u[IU]/mL (ref 0.35–5.50)

## 2023-02-16 LAB — HEMOGLOBIN A1C: Hgb A1c MFr Bld: 5.9 % (ref 4.6–6.5)

## 2023-02-16 LAB — FOLLICLE STIMULATING HORMONE: FSH: 94.1 m[IU]/mL

## 2023-02-22 ENCOUNTER — Encounter (HOSPITAL_COMMUNITY): Payer: Self-pay

## 2023-02-22 ENCOUNTER — Other Ambulatory Visit (HOSPITAL_COMMUNITY): Payer: 59

## 2023-02-22 ENCOUNTER — Encounter (HOSPITAL_COMMUNITY): Payer: 59

## 2023-02-22 ENCOUNTER — Ambulatory Visit (HOSPITAL_COMMUNITY)
Admission: RE | Admit: 2023-02-22 | Discharge: 2023-02-22 | Disposition: A | Discharge status: Home / Self Care | Payer: 59 | Source: Ambulatory Visit | Attending: Nurse Practitioner | Admitting: Nurse Practitioner

## 2023-02-22 DIAGNOSIS — Z1239 Encounter for other screening for malignant neoplasm of breast: Secondary | ICD-10-CM | POA: Diagnosis present

## 2023-02-22 DIAGNOSIS — N6323 Unspecified lump in the left breast, lower outer quadrant: Secondary | ICD-10-CM | POA: Diagnosis not present

## 2023-02-22 DIAGNOSIS — N6320 Unspecified lump in the left breast, unspecified quadrant: Secondary | ICD-10-CM | POA: Diagnosis present

## 2023-02-22 LAB — PROGESTERONE: Progesterone: 0.7 ng/mL

## 2023-02-22 LAB — TESTOSTERONE, TOTAL, LC/MS/MS

## 2023-02-22 LAB — LUTEINIZING HORMONE: LH: 40.2 m[IU]/mL

## 2023-02-22 LAB — ESTRADIOL: Estradiol: 22 pg/mL

## 2023-02-22 LAB — ESTROGENS, TOTAL: Estrogen: 162 pg/mL

## 2023-02-24 ENCOUNTER — Encounter: Payer: Self-pay | Admitting: Nurse Practitioner

## 2023-03-03 ENCOUNTER — Telehealth: Payer: Self-pay | Admitting: Nurse Practitioner

## 2023-03-03 ENCOUNTER — Telehealth (INDEPENDENT_AMBULATORY_CARE_PROVIDER_SITE_OTHER): Payer: 59 | Admitting: Nurse Practitioner

## 2023-03-03 DIAGNOSIS — Z6839 Body mass index (BMI) 39.0-39.9, adult: Secondary | ICD-10-CM | POA: Diagnosis not present

## 2023-03-03 DIAGNOSIS — R7303 Prediabetes: Secondary | ICD-10-CM

## 2023-03-03 DIAGNOSIS — E785 Hyperlipidemia, unspecified: Secondary | ICD-10-CM | POA: Diagnosis not present

## 2023-03-03 DIAGNOSIS — E669 Obesity, unspecified: Secondary | ICD-10-CM | POA: Diagnosis not present

## 2023-03-03 MED ORDER — WEGOVY 0.5 MG/0.5ML ~~LOC~~ SOAJ: 0.5000 mg | SUBCUTANEOUS | 1 refills | Status: DC

## 2023-03-03 MED ORDER — WEGOVY 0.25 MG/0.5ML ~~LOC~~ SOAJ: 0.2500 mg | SUBCUTANEOUS | 1 refills | Status: DC

## 2023-03-03 NOTE — Progress Notes (Signed)
   Established Patient Office Visit  An audio/visual tele-health visit was completed today for this patient. I connected with  Carol Dixon on 03/03/23 utilizing audio/visual technology and verified that I am speaking with the correct person using two identifiers. The patient was located at their home, and I was located at the office of [facility] at [facility] during the encounter. I discussed the limitations of evaluation and management by telemedicine. The patient expressed understanding and agreed to proceed.     Subjective   Patient ID: Carol Dixon, female    DOB: 1976-08-12  Age: 47 y.o. MRN: [id number]  Chief Complaint  Patient presents with   Medical Management of Chronic Issues    Follow up on blood work     Obesity: We have discussed GLP-1 agonist for treatment of obesity.  She has had chronic obesity with inability to maintain weight loss with lifestyle management alone.  She continues to work on eating a healthy low calorie diet and increasing her physical activity as her schedule allows.  She denies any personal or family history of thyroid cancer.  Denies any personal history of pancreatitis or gallstones.  Has not had a menstrual cycle for a total of 12 months.  Last labs indicate low estradiol but immediate progesterone level.  She also has prediabetes with last A1c of 5.9 and hyperlipidemia with LDL of 140. BMI last office visit which was on 02/10/2023 was 39.59          Objective:     There were no vitals taken for this visit.   Physical Exam Comprehensive physical exam not completed today as office visit was conducted remotely.  Patient appeared well over video and was in no acute distress.  Patient was alert and oriented, and appeared to have appropriate judgment.   No results found for any visits on 03/03/23.    The 10-year ASCVD risk score (Arnett DK, et al., 2019) is: 3.3%    Assessment & Plan:   Problem List Items Addressed This Visit        Other   Prediabetes    Chronic Last A1c 5.9 Continue to focus on lifestyle modification       Hyperlipidemia    Chronic Last LDL 140, ASCVD risk were 3.3% Recommend patient continue to focus on healthy lifestyle modification as opposed to starting pharmacological therapy.  We did discuss possibly undergoing cardiac calcium CT scan, patient elected to defer this for now.      Obesity - Primary    Chronic Patient would like to start [facility] I did consult with supervising physician regarding patient probable perimenopausal status as opposed to postmenopausal status.  She recommends that as long as patient has been educated to prevent pregnancy while on Wegovy that it is reasonable to prescribe [facility] without patient being on prescribed contraception. Patient educated to avoid pregnancy with barrier method, and if pregnancy were to occur to immediately stop Wegovy.  She reports understanding. Will start her on Wegovy 0.25mg  weekly injection x 4 weeks then increase to 0.5 mg weekly injection. Patient educated on possible side effects Patient educated on how to rotate injection sites Patient educated on how to dispose of needles properly.      Relevant Medications   Semaglutide-Weight Management (WEGOVY) 0.25 MG/0.5ML SOAJ   Semaglutide-Weight Management (WEGOVY) 0.5 MG/0.5ML SOAJ    Return in about 6 weeks (around 04/14/2023) for F/U with Barack Nicodemus.    Elenore Paddy, NP

## 2023-03-03 NOTE — Assessment & Plan Note (Signed)
Chronic Last A1c 5.9 Continue to focus on lifestyle modification

## 2023-03-03 NOTE — Assessment & Plan Note (Signed)
Chronic Patient would like to start [facility] I did consult with supervising physician regarding patient probable perimenopausal status as opposed to postmenopausal status.  She recommends that as long as patient has been educated to prevent pregnancy while on Wegovy that it is reasonable to prescribe [facility] without patient being on prescribed contraception. Patient educated to avoid pregnancy with barrier method, and if pregnancy were to occur to immediately stop Wegovy.  She reports understanding. Will start her on Wegovy 0.25mg  weekly injection x 4 weeks then increase to 0.5 mg weekly injection. Patient educated on possible side effects Patient educated on how to rotate injection sites Patient educated on how to dispose of needles properly.

## 2023-03-03 NOTE — Telephone Encounter (Signed)
Start prior auth on Wegovy, starting BMI 39.5

## 2023-03-03 NOTE — Assessment & Plan Note (Signed)
Chronic Last LDL 140, ASCVD risk were 3.3% Recommend patient continue to focus on healthy lifestyle modification as opposed to starting pharmacological therapy.  We did discuss possibly undergoing cardiac calcium CT scan, patient elected to defer this for now.

## 2023-03-07 NOTE — Telephone Encounter (Signed)
Submitted w/ (Key: [id number]). PA has been sent to [facility].Marland KitchenRaechel Chute

## 2023-03-10 ENCOUNTER — Ambulatory Visit: Payer: 59 | Admitting: Nurse Practitioner

## 2023-03-14 ENCOUNTER — Other Ambulatory Visit (HOSPITAL_COMMUNITY): Payer: Self-pay

## 2023-04-14 ENCOUNTER — Telehealth (INDEPENDENT_AMBULATORY_CARE_PROVIDER_SITE_OTHER): Payer: 59 | Admitting: Nurse Practitioner

## 2023-04-14 ENCOUNTER — Other Ambulatory Visit (HOSPITAL_COMMUNITY): Payer: Self-pay

## 2023-04-14 DIAGNOSIS — E669 Obesity, unspecified: Secondary | ICD-10-CM | POA: Diagnosis not present

## 2023-04-14 DIAGNOSIS — Z6839 Body mass index (BMI) 39.0-39.9, adult: Secondary | ICD-10-CM | POA: Diagnosis not present

## 2023-04-14 MED ORDER — SEMAGLUTIDE-WEIGHT MANAGEMENT 1 MG/0.5ML ~~LOC~~ SOAJ
1.0000 mg | SUBCUTANEOUS | 1 refills | Status: DC
Start: 2023-05-05 — End: 2023-05-26
  Filled 2023-04-14: qty 2, fill #0

## 2023-04-14 MED ORDER — WEGOVY 0.5 MG/0.5ML ~~LOC~~ SOAJ: 0.5000 mg | SUBCUTANEOUS | 1 refills | Status: DC

## 2023-04-14 MED ORDER — WEGOVY 0.5 MG/0.5ML ~~LOC~~ SOAJ
0.5000 mg | SUBCUTANEOUS | 1 refills | Status: DC
Start: 2023-04-14 — End: 2023-06-30
  Filled 2023-04-14 – 2023-05-26 (×2): qty 2, 28d supply, fill #0
  Filled 2023-06-13: qty 2, 28d supply, fill #1
  Filled ????-??-??: fill #1

## 2023-04-14 MED ORDER — SEMAGLUTIDE-WEIGHT MANAGEMENT 1 MG/0.5ML ~~LOC~~ SOAJ: 1.0000 mg | SUBCUTANEOUS | 1 refills | Status: DC

## 2023-04-14 NOTE — Assessment & Plan Note (Signed)
Chronic Starting BMI 39.59 Current BMI 39.18 Has been on this for 3 weeks now, tolerating well. Patient to take 1 more 0.25 mg dose of Wegovy then will increase to 0.5 mg weekly injection of Wegovy will complete 4 doses of this.  Then increase to 1 mg weekly injection of Wegovy. If again in 2 months to determine tolerance and possible increase dose to 1.7mg /week

## 2023-04-14 NOTE — Patient Instructions (Signed)
[facility]: [phone]

## 2023-04-14 NOTE — Progress Notes (Signed)
   Established Patient Office Visit  An audio/visual tele-health visit was completed today for this patient. I connected with  Carol Dixon on 04/14/23 utilizing audio/visual technology and verified that I am speaking with the correct person using two identifiers. The patient was located at their place of employment, and I was located at the office of [facility] at [facility] during the encounter. I discussed the limitations of evaluation and management by telemedicine. The patient expressed understanding and agreed to proceed.     Subjective   Patient ID: Carol Dixon, female    DOB: [birth year]  Age: 47 y.o. MRN: [id number]  Chief Complaint  Patient presents with   Obesity    Obesity: Currently on Wegovy 0.25 weekly injection, has completed 3 doses.  Tolerating medication well.  Denies abdominal pain, nausea, vomiting, constipation, diarrhea.  Has lost about 2 pounds since last office visit.    ROS: see HPI    Objective:     Ht 4\' 11"  (1.499 m)   Wt 194 lb (88 kg)   BMI 39.18 kg/m  BP Readings from Last 3 Encounters:  02/10/23 114/86  12/24/22 (!) 139/95  11/07/22 129/80   Wt Readings from Last 3 Encounters:  04/14/23 194 lb (88 kg)  02/10/23 196 lb (88.9 kg)  07/29/22 191 lb (86.6 kg)      Physical Exam Comprehensive physical exam not completed today as office visit was conducted remotely.  Appears well over video, no acute distress.  Patient was alert and oriented, and appeared to have appropriate judgment.   No results found for any visits on 04/14/23.    The 10-year ASCVD risk score (Arnett DK, et al., 2019) is: 3.3%    Assessment & Plan:   Problem List Items Addressed This Visit       Other   Obesity    Chronic Starting BMI 39.59 Current BMI 39.18 Has been on this for 3 weeks now, tolerating well. Patient to take 1 more 0.25 mg dose of Wegovy then will increase to 0.5 mg weekly injection of Wegovy will complete 4 doses of this.  Then  increase to 1 mg weekly injection of Wegovy. If again in 2 months to determine tolerance and possible increase dose to 1.7mg /week      Relevant Medications   Semaglutide-Weight Management (WEGOVY) 0.5 MG/0.5ML SOAJ   Semaglutide-Weight Management 1 MG/0.5ML SOAJ (Start on 05/05/2023)    Return in about 2 months (around 06/14/2023) for F/U with Maralyn Sago.    Elenore Paddy, NP

## 2023-04-15 ENCOUNTER — Other Ambulatory Visit (HOSPITAL_COMMUNITY): Payer: Self-pay

## 2023-04-27 ENCOUNTER — Encounter: Payer: Self-pay | Admitting: Nurse Practitioner

## 2023-05-26 ENCOUNTER — Other Ambulatory Visit (HOSPITAL_COMMUNITY): Payer: Self-pay

## 2023-05-26 ENCOUNTER — Telehealth (INDEPENDENT_AMBULATORY_CARE_PROVIDER_SITE_OTHER): Payer: 59 | Admitting: Nurse Practitioner

## 2023-05-26 DIAGNOSIS — Z6839 Body mass index (BMI) 39.0-39.9, adult: Secondary | ICD-10-CM | POA: Diagnosis not present

## 2023-05-26 DIAGNOSIS — E669 Obesity, unspecified: Secondary | ICD-10-CM | POA: Diagnosis not present

## 2023-05-26 MED ORDER — SEMAGLUTIDE-WEIGHT MANAGEMENT 1 MG/0.5ML ~~LOC~~ SOAJ
1.0000 mg | SUBCUTANEOUS | 1 refills | Status: DC
Start: 2023-05-26 — End: 2023-05-26

## 2023-05-26 MED ORDER — SEMAGLUTIDE-WEIGHT MANAGEMENT 1 MG/0.5ML ~~LOC~~ SOAJ
1.0000 mg | SUBCUTANEOUS | 1 refills | Status: DC
Start: 2023-05-26 — End: 2023-06-30
  Filled 2023-05-26 – 2023-06-16 (×5): qty 2, 28d supply, fill #0

## 2023-05-26 NOTE — Assessment & Plan Note (Addendum)
Chronic Starting BMI 39.59, weight 196 pounds in 02/2023 Current weight 192 pounds, current BMI 37.78 ~2% weight loss thus far Patient will plan on taking 0.5 mg/week x 4 doses then increase to 1 mg/week.  Follow-up as scheduled next month, or sooner as needed.

## 2023-05-26 NOTE — Progress Notes (Signed)
   Established Patient Office Visit  An audio/visual tele-health visit was completed today for this patient. I connected with  Carol Dixon on 05/26/23 utilizing audio/visual technology and verified that I am speaking with the correct person. The patient was located at their home, and I was located at the office of [facility] at [facility] during the encounter.    Subjective   Patient ID: Carol Dixon, female    DOB: Aug 07, 1976  Age: 47 y.o. MRN: [id number]  Chief Complaint  Patient presents with   Obesity   Obesity: Starting BMI 39.59, 03/03/2023.  On Wegovy, there was an issue at the pharmacy so she was unable to increase dose since last office visit so she has been on the 0.25 mg/week dose.  She is getting ready to start 0.5 mg/week dose within the next day or so.  So far continues to tolerate the medication well.  Has some loose stools intermittently but no significant diarrhea, nausea, vomiting, abdominal pain, swelling or masses to the throat.  Would like to plan on increasing to the 1 mg/week dose if she tolerates at least 4 doses of the 0.5 mg/week.  Continues to participate in exercise when she is able to, she has a hectic schedule including frequent travel for work at this time.     ROS: see HPI    Objective:     Ht 4\' 11"  (1.499 m)   Wt 192 lb (87.1 kg)   BMI 38.78 kg/m  BP Readings from Last 3 Encounters:  02/10/23 114/86  12/24/22 (!) 139/95  11/07/22 129/80   Wt Readings from Last 3 Encounters:  05/26/23 192 lb (87.1 kg)  04/14/23 194 lb (88 kg)  02/10/23 196 lb (88.9 kg)      Physical Exam Comprehensive physical exam not completed today as office visit was conducted remotely.  Patient appears well over video, no acute distress noted.  Patient was alert and oriented, and appeared to have appropriate judgment.   No results found for any visits on 05/26/23.     The 10-year ASCVD risk score (Arnett DK, et al., 2019) is: 3.3%    Assessment &  Plan:   Problem List Items Addressed This Visit       Other   Obesity    Chronic Starting BMI 39.59, weight 196 pounds in 02/2023 Current weight 192 pounds, current BMI 37.78 ~2% weight loss thus far Patient will plan on taking 0.5 mg/week x 4 doses then increase to 1 mg/week.  Follow-up as scheduled next month, or sooner as needed.       Relevant Medications   Semaglutide-Weight Management 1 MG/0.5ML SOAJ    Return in about 1 month (around 06/26/2023) for F/U with Naleyah Ohlinger.    Elenore Paddy, NP

## 2023-05-27 ENCOUNTER — Other Ambulatory Visit (HOSPITAL_COMMUNITY): Payer: Self-pay

## 2023-06-13 ENCOUNTER — Other Ambulatory Visit (HOSPITAL_COMMUNITY): Payer: Self-pay

## 2023-06-16 ENCOUNTER — Other Ambulatory Visit (HOSPITAL_COMMUNITY): Payer: Self-pay

## 2023-06-30 ENCOUNTER — Telehealth (INDEPENDENT_AMBULATORY_CARE_PROVIDER_SITE_OTHER): Payer: 59 | Admitting: Nurse Practitioner

## 2023-06-30 ENCOUNTER — Other Ambulatory Visit (HOSPITAL_COMMUNITY): Payer: Self-pay

## 2023-06-30 VITALS — Ht 59.0 in | Wt 186.0 lb

## 2023-06-30 DIAGNOSIS — E669 Obesity, unspecified: Secondary | ICD-10-CM

## 2023-06-30 DIAGNOSIS — Z6839 Body mass index (BMI) 39.0-39.9, adult: Secondary | ICD-10-CM

## 2023-06-30 MED ORDER — WEGOVY 1.7 MG/0.75ML ~~LOC~~ SOAJ
1.7000 mg | SUBCUTANEOUS | 0 refills | Status: DC
Start: 2023-06-30 — End: 2023-08-11
  Filled 2023-06-30 – 2023-07-17 (×5): qty 3, 28d supply, fill #0

## 2023-06-30 NOTE — Assessment & Plan Note (Signed)
Chronic Today's weight 186 pounds, today's BMI 37.57.  This equals approximately 5% weight loss Patient is tolerating well. Continue Wegovy 1 mg/week x 3 more doses then we will plan to increase to 1.7 mg/week.  Follow-up in approximately 6 weeks or sooner as needed.

## 2023-06-30 NOTE — Progress Notes (Signed)
Established Patient Office Visit  An audio/visual tele-health visit was completed today for this patient. I connected with  Carol Dixon on 06/30/23 utilizing audio/visual technology and verified that I am speaking with the correct person using two identifiers. The patient was located at their home, and I was located at the office of [facility] at [facility] during the encounter. I discussed the limitations of evaluation and management by telemedicine. The patient expressed understanding and agreed to proceed.     Subjective   Patient ID: Carol Dixon, female    DOB: 1976-05-27  Age: 47 y.o. MRN: [id number]  Chief Complaint  Patient presents with   Obesity   Obesity patient continues on Wegovy and is tolerating well.  She reports she just started the 1 mg/week dose.  So far tolerating well.  Has diarrhea at times but otherwise no significant side effects.  Would like to continue taking the medication.   Starting weight: 196 pounds, starting BMI 39.59 Today's weight: 186 pounds, today's BMI: 37.57 Thus far she has lost about 5% body weight     Review of Systems  Gastrointestinal:  Positive for diarrhea. Negative for abdominal pain, constipation and nausea.      Objective:     Ht 4\' 11"  (1.499 m)   Wt 186 lb (84.4 kg)   BMI 37.57 kg/m  BP Readings from Last 3 Encounters:  02/10/23 114/86  12/24/22 (!) 139/95  11/07/22 129/80   Wt Readings from Last 3 Encounters:  06/30/23 186 lb (84.4 kg)  05/26/23 192 lb (87.1 kg)  04/14/23 194 lb (88 kg)      Physical Exam Comprehensive physical exam not completed today as office visit was conducted remotely.  Patient appears well over video, no acute distress noted..  Patient was alert and oriented, and appeared to have appropriate judgment.   No results found for any visits on 06/30/23.    The 10-year ASCVD risk score (Arnett DK, et al., 2019) is: 3.3%    Assessment & Plan:   Problem List Items Addressed  This Visit       Other   Obesity - Primary    Chronic Today's weight 186 pounds, today's BMI 37.57.  This equals approximately 5% weight loss Patient is tolerating well. Continue Wegovy 1 mg/week x 3 more doses then we will plan to increase to 1.7 mg/week.  Follow-up in approximately 6 weeks or sooner as needed.      Relevant Medications   Semaglutide-Weight Management (WEGOVY) 1.7 MG/0.75ML SOAJ    Return in about 6 weeks (around 08/11/2023) for F/U with Greyson Peavy.    Elenore Paddy, NP

## 2023-07-13 ENCOUNTER — Other Ambulatory Visit (HOSPITAL_COMMUNITY): Payer: Self-pay

## 2023-07-13 ENCOUNTER — Other Ambulatory Visit: Payer: Self-pay

## 2023-07-17 ENCOUNTER — Other Ambulatory Visit (HOSPITAL_COMMUNITY): Payer: Self-pay

## 2023-07-18 ENCOUNTER — Other Ambulatory Visit (HOSPITAL_COMMUNITY): Payer: Self-pay

## 2023-07-20 ENCOUNTER — Other Ambulatory Visit (HOSPITAL_COMMUNITY): Payer: Self-pay

## 2023-08-01 ENCOUNTER — Other Ambulatory Visit (HOSPITAL_COMMUNITY): Payer: Self-pay

## 2023-08-01 ENCOUNTER — Telehealth: Payer: Self-pay

## 2023-08-01 NOTE — Telephone Encounter (Signed)
Pharmacy Patient Advocate Encounter   Received notification from CoverMyMeds that prior authorization for [facility] 1.7mg /0.57ml is required/requested.   Insurance verification completed.   The patient is insured through [facility] .   Per test claim: PA required; PA submitted to [facility] via CoverMyMeds Key/confirmation #/EOC [id number] Status is pending

## 2023-08-03 ENCOUNTER — Other Ambulatory Visit (HOSPITAL_COMMUNITY): Payer: Self-pay

## 2023-08-03 NOTE — Telephone Encounter (Signed)
Pharmacy Patient Advocate Encounter  Received notification from [facility] that Prior Authorization for [facility] 1.7mg /0.47ml has been APPROVED from 03/07/23 to 03/06/24   PA #/Case ID/Reference #: [id number]

## 2023-08-04 ENCOUNTER — Encounter: Payer: Self-pay | Admitting: Nurse Practitioner

## 2023-08-11 ENCOUNTER — Other Ambulatory Visit: Payer: Self-pay | Admitting: Nurse Practitioner

## 2023-08-11 ENCOUNTER — Other Ambulatory Visit (HOSPITAL_COMMUNITY): Payer: Self-pay

## 2023-08-11 DIAGNOSIS — Z6839 Body mass index (BMI) 39.0-39.9, adult: Secondary | ICD-10-CM

## 2023-08-11 MED ORDER — WEGOVY 2.4 MG/0.75ML ~~LOC~~ SOAJ
2.4000 mg | SUBCUTANEOUS | 6 refills | Status: DC
Start: 2023-08-11 — End: 2023-12-29
  Filled 2023-08-11: qty 3, 28d supply, fill #0
  Filled 2023-09-06: qty 3, 28d supply, fill #1
  Filled 2023-10-04: qty 3, 28d supply, fill #2
  Filled 2023-11-01: qty 3, 28d supply, fill #3

## 2023-08-14 ENCOUNTER — Other Ambulatory Visit (HOSPITAL_COMMUNITY): Payer: Self-pay

## 2023-08-17 ENCOUNTER — Encounter: Payer: Self-pay | Admitting: Nurse Practitioner

## 2023-08-19 ENCOUNTER — Encounter: Payer: Self-pay | Admitting: Nurse Practitioner

## 2023-08-19 NOTE — Progress Notes (Signed)
This encounter was created in error - please disregard.

## 2023-11-07 ENCOUNTER — Other Ambulatory Visit (HOSPITAL_COMMUNITY): Payer: Self-pay

## 2023-11-07 ENCOUNTER — Encounter: Payer: Self-pay | Admitting: Nurse Practitioner

## 2023-11-10 ENCOUNTER — Other Ambulatory Visit (HOSPITAL_COMMUNITY): Payer: Self-pay

## 2023-11-10 ENCOUNTER — Telehealth: Payer: Self-pay

## 2023-11-10 NOTE — Telephone Encounter (Signed)
Pharmacy Patient Advocate Encounter   Received notification from Patient Advice Request messages that prior authorization for Wegovy 2.4MG /0.75ML auto-injectors is required/requested.   Insurance verification completed.   The patient is insured through [facility] .   Per test claim: PA required; PA submitted to above mentioned insurance via CoverMyMeds Key/confirmation #/EOC  [id number] Status is pending

## 2023-11-13 ENCOUNTER — Other Ambulatory Visit (HOSPITAL_COMMUNITY): Payer: Self-pay

## 2023-11-13 NOTE — Telephone Encounter (Signed)
Copied from CRM [id number]. Topic: Clinical - Medication Question >> Nov 13, 2023 11:56 AM Almira Coaster wrote: Reason for CRM: Leta Jungling from [facility] is calling to advise that the authorization for [facility] has been denied. Per Leta Jungling additional information is required, to set up a peer to peer the number is [phone] fax number is [phone].

## 2023-11-13 NOTE — Telephone Encounter (Signed)
Pharmacy Patient Advocate Encounter  Received notification from [facility] that Prior Authorization for Wegovy 2.4MG /0.75ML auto-injectors has been DENIED.  No reason given; No denial letter received via Fax or CMM. It has been requested and will be uploaded to the media tab once received.   PA #/Case ID/Reference #: [id number]  *An appeal is available if the Dr says its medically fit to ask for one. Please let us know.

## 2023-11-14 ENCOUNTER — Encounter: Payer: Self-pay | Admitting: Nurse Practitioner

## 2023-11-16 ENCOUNTER — Telehealth: Payer: Self-pay | Admitting: Nurse Practitioner

## 2023-11-16 NOTE — Telephone Encounter (Signed)
Please call patient and see if you can get her scheduled with me for a visit tomorrow either in person or virtually.  I did the peer to peer regarding her weight loss medication, the reason they denied it is because they need to see documentation in my notes that she is following diet and exercise recommendations as well as that she has lost 7.5% of her weight within 6 months of starting the medication.  Based on documents that they have currently, she lost 5% over 4 months so they denied her because she had not reached a 7.5% threshold.  However because it was only 4 months and not within 6 months if she is now up at 7.5% threshold we can file an appeal and hopefully get it approved now.  Thus we need to do a visit as soon as possible so we can consider starting the appeal process as soon as possible.

## 2023-11-16 NOTE — Telephone Encounter (Signed)
Made pt aware of provider not, pt is schedule on 11/17/23 virtual

## 2023-11-17 ENCOUNTER — Telehealth: Payer: Self-pay | Admitting: Nurse Practitioner

## 2023-11-17 ENCOUNTER — Telehealth (INDEPENDENT_AMBULATORY_CARE_PROVIDER_SITE_OTHER): Payer: BC Managed Care – PPO | Admitting: Nurse Practitioner

## 2023-11-17 VITALS — Ht 59.0 in | Wt 176.0 lb

## 2023-11-17 DIAGNOSIS — Z6839 Body mass index (BMI) 39.0-39.9, adult: Secondary | ICD-10-CM

## 2023-11-17 DIAGNOSIS — E66812 Obesity, class 2: Secondary | ICD-10-CM | POA: Diagnosis not present

## 2023-11-17 DIAGNOSIS — Z6835 Body mass index (BMI) 35.0-35.9, adult: Secondary | ICD-10-CM

## 2023-11-17 NOTE — Progress Notes (Signed)
   Established Patient Office Visit  An audio/visual tele-health visit was completed today for this patient. I connected with  Carol Dixon on 11/17/23 utilizing audio/visual technology and verified that I am speaking with the correct person using two identifiers. The patient was located at their home, and I was located at the office of [facility] at [facility] during the encounter. I discussed the limitations of evaluation and management by telemedicine. The patient expressed understanding and agreed to proceed.    Subjective   Patient ID: Carol Dixon, female    DOB: 06-05-1976  Age: 48 y.o. MRN: [id number]  Chief Complaint  Patient presents with   Obesity    Obesity: Patient has been on Wegovy 2.4 mg weekly injection.  Insurance is now denying coverage.  I did complete peer to peer yesterday and they provider reasons why coverage was denied.  They did state appeal could be made if further information is collected from patient.  So today we are meeting virtually to discuss further.  Currently, patient has missed 1 dose of Wegovy.  If she does not have approval for medication within the next 4 days she will have missed 2 doses.  She has been tolerating Wegovy well since May 2024.  Starting weight was 196 pounds, current weight is 176 pounds based on at home scale and patient reporting. This indicates a 10% weight loss since starting the medication.  She reports that she has been following a plant focused diet and has been lifting weights 4 times a week for 30 minutes per session as well as walking 1 mile during her lunch break 3 times a week.  She has comorbid hyperlipidemia.  She also has prediabetes.  She has history of hypertension, and 2023 she had documented elevated blood pressure on at home blood pressure cuff.  From office visit 07/2022: "elevated at last office visit. She has checked blood pressure at home periodically, at home readings are as follows 134/83, 138/89, 130/98,  143/96, 161/101, 138/89, 126/95."  More recently, her blood pressure has been at goal without requiring treatment.  She does have significant family history of cardiovascular disease.    ROS: see HPI    Objective:     Ht 4\' 11"  (1.499 m)   Wt 176 lb (79.8 kg)   BMI 35.55 kg/m  BP Readings from Last 3 Encounters:  02/10/23 114/86  12/24/22 (!) 139/95  11/07/22 129/80   Wt Readings from Last 3 Encounters:  11/17/23 176 lb (79.8 kg)  06/30/23 186 lb (84.4 kg)  05/26/23 192 lb (87.1 kg)      Physical Exam Comprehensive physical exam not completed today as office visit was conducted remotely.  Patient appears well over video.  Patient was alert and oriented, and appeared to have appropriate judgment.   No results found for any visits on 11/17/23.    The 10-year ASCVD risk score (Arnett DK, et al., 2019) is: 3.3%    Assessment & Plan:   Problem List Items Addressed This Visit       Other   Obesity - Primary    No follow-ups on file.    Elenore Paddy, NP

## 2023-11-17 NOTE — Telephone Encounter (Signed)
Please ask prior authorization team to initiate expedited urgent appeal appeal of insurance denial for [facility] for treatment of obesity.  If we do not get insurance decision by 11/21/23 then she will have been off of the medication for more than 2 weeks which would cause [place] to have to initiate her on the lowest dose and slowly work her way up over 20 weeks to the target dose for weight loss maintenance.    We had updated virtual visit on 11/17/2023 which indicated that she has had a 10% weight loss since starting Wegovy.  She also has comorbid hyperlipidemia and has a history of hypertension that was documented in 2023.  Blood pressures have been better controlled as of late and has not required antihypertensive therapy.  She has been adherent to a plant focused diet as well as exercising 3-4 times a week combination of walking and strength training.  Please let me know if any additional information is needed for the appeal.

## 2023-11-17 NOTE — Assessment & Plan Note (Signed)
Chronic Starting weight 196 pounds, starting BMI 39.59 Current weight 176 pounds, current BMI 35.55 This indicates over 10% weight loss She has been tolerating medication well.  Would like to continue this. She does have comorbid conditions including hyperlipidemia and history of hypertension however blood pressure has been much better controlled likely related to her current weight loss. She has been adherent to dietary changes and exercise as well. We will appeal the denial from her insurance company for [facility] coverage, hopefully this will be approved and we can restart Wegovy. Patient will follow-up with me in office in about 2 months for annual physical. Labs will be ordered for physical and she will get this drawn at least 3 business days prior to her upcoming appointment.

## 2023-11-20 ENCOUNTER — Other Ambulatory Visit (HOSPITAL_COMMUNITY): Payer: Self-pay

## 2023-11-20 ENCOUNTER — Telehealth: Payer: Self-pay

## 2023-11-20 NOTE — Telephone Encounter (Signed)
[employer], peer to peer was done on 11/16/23, process for appeal is in order

## 2023-11-20 NOTE — Telephone Encounter (Signed)
Pharmacy Patient Advocate Encounter   Received notification from [employer] that an appeal for [facility] 2.4mg /0.75ml is required/requested.   Insurance verification completed.   The patient is insured through [facility] .   Appeal faxed to [phone]. Status is pending.   Phone# [phone]

## 2023-11-22 ENCOUNTER — Encounter: Payer: Self-pay | Admitting: Nurse Practitioner

## 2023-11-23 ENCOUNTER — Other Ambulatory Visit (HOSPITAL_COMMUNITY): Payer: Self-pay

## 2023-11-23 NOTE — Telephone Encounter (Signed)
 Pharmacy Patient Advocate Encounter  Received notification from [employer] that an appeal for Wegovy  has been DENIED.  Full denial letter will be uploaded to the media tab. See denial reason below.   PA #/Case ID/Reference #: [id number]

## 2023-12-05 ENCOUNTER — Other Ambulatory Visit (HOSPITAL_COMMUNITY): Payer: Self-pay

## 2023-12-22 ENCOUNTER — Encounter: Payer: BC Managed Care – PPO | Admitting: Nurse Practitioner

## 2023-12-25 ENCOUNTER — Other Ambulatory Visit (INDEPENDENT_AMBULATORY_CARE_PROVIDER_SITE_OTHER)

## 2023-12-25 DIAGNOSIS — Z6839 Body mass index (BMI) 39.0-39.9, adult: Secondary | ICD-10-CM

## 2023-12-25 DIAGNOSIS — E66812 Obesity, class 2: Secondary | ICD-10-CM

## 2023-12-25 LAB — CBC
HCT: 41.3 % (ref 36.0–46.0)
Hemoglobin: 13.8 g/dL (ref 12.0–15.0)
MCHC: 33.5 g/dL (ref 30.0–36.0)
MCV: 89.4 fl (ref 78.0–100.0)
Platelets: 296 10*3/uL (ref 150.0–400.0)
RBC: 4.61 Mil/uL (ref 3.87–5.11)
RDW: 13.5 % (ref 11.5–15.5)
WBC: 6.4 10*3/uL (ref 4.0–10.5)

## 2023-12-25 LAB — LIPID PANEL
Cholesterol: 193 mg/dL (ref 0–200)
HDL: 47.9 mg/dL (ref >39.00)
LDL Cholesterol: 112 mg/dL — ABNORMAL HIGH (ref 0–99)
NonHDL: 145.47
Total CHOL/HDL Ratio: 4
Triglycerides: 167 mg/dL — ABNORMAL HIGH (ref 0.0–149.0)
VLDL: 33.4 mg/dL (ref 0.0–40.0)

## 2023-12-25 LAB — COMPREHENSIVE METABOLIC PANEL
ALT: 18 U/L (ref 0–35)
AST: 17 U/L (ref 0–37)
Albumin: 4.5 g/dL (ref 3.5–5.2)
Alkaline Phosphatase: 100 U/L (ref 39–117)
BUN: 14 mg/dL (ref 6–23)
CO2: 26 meq/L (ref 19–32)
Calcium: 9.6 mg/dL (ref 8.4–10.5)
Chloride: 103 meq/L (ref 96–112)
Creatinine, Ser: 0.69 mg/dL (ref 0.40–1.20)
GFR: 103.71 mL/min (ref >60.00)
Glucose, Bld: 85 mg/dL (ref 70–99)
Potassium: 4.2 meq/L (ref 3.5–5.1)
Sodium: 138 meq/L (ref 135–145)
Total Bilirubin: 0.4 mg/dL (ref 0.2–1.2)
Total Protein: 7.2 g/dL (ref 6.0–8.3)

## 2023-12-25 LAB — TSH: TSH: 2.58 u[IU]/mL (ref 0.35–5.50)

## 2023-12-25 LAB — HEMOGLOBIN A1C: Hgb A1c MFr Bld: 5.8 % (ref 4.6–6.5)

## 2023-12-28 ENCOUNTER — Encounter: Payer: Self-pay | Admitting: Nurse Practitioner

## 2023-12-28 NOTE — Telephone Encounter (Signed)
 This encounter was created in error - please disregard.

## 2023-12-29 ENCOUNTER — Encounter: Payer: Self-pay | Admitting: Nurse Practitioner

## 2023-12-29 ENCOUNTER — Telehealth: Payer: Self-pay | Admitting: Nurse Practitioner

## 2023-12-29 ENCOUNTER — Ambulatory Visit: Payer: BC Managed Care – PPO | Admitting: Nurse Practitioner

## 2023-12-29 VITALS — BP 118/86 | HR 102 | Temp 98.4°F | Ht 59.0 in | Wt 184.0 lb

## 2023-12-29 DIAGNOSIS — R609 Edema, unspecified: Secondary | ICD-10-CM | POA: Insufficient documentation

## 2023-12-29 DIAGNOSIS — E66812 Obesity, class 2: Secondary | ICD-10-CM

## 2023-12-29 DIAGNOSIS — R7303 Prediabetes: Secondary | ICD-10-CM

## 2023-12-29 DIAGNOSIS — Z0000 Encounter for general adult medical examination without abnormal findings: Secondary | ICD-10-CM | POA: Diagnosis not present

## 2023-12-29 DIAGNOSIS — Z0001 Encounter for general adult medical examination with abnormal findings: Secondary | ICD-10-CM | POA: Insufficient documentation

## 2023-12-29 DIAGNOSIS — Z6839 Body mass index (BMI) 39.0-39.9, adult: Secondary | ICD-10-CM | POA: Diagnosis not present

## 2023-12-29 NOTE — Assessment & Plan Note (Addendum)
 Chronic, intermittent Etiology unclear No swelling on exam today, strength bilateral and normal to both hands, radial pulses 2+ bilaterally, no sensory deficit noted on exam today, no discoloration of hands or fingers. Discussed with supervising physician and question whether or not this could be neurologic or vascular in nature.  Because the swelling seems to be the predominant symptom possibly more likely to be vascular, thus we will recommend referral to vascular surgeon to see if further evaluation can identify etiology.  Will offer this referral option to patient and if she agrees we will order this.

## 2023-12-29 NOTE — Assessment & Plan Note (Signed)
 Chronic, stable A1c of 5.8 I believe patient would benefit from weight loss medication especially weight loss medication that can also help with managing blood sugar such as Wegovy.  Will reattempt prior authorization.  Further recommendations will be made to the patient based on insurance response.

## 2023-12-29 NOTE — Assessment & Plan Note (Signed)
 Chronic Starting BMI initially was 39.59 with a starting weight of 196 pounds Current weight 184 pounds with current BMI of 37.16 Patient has been working on exercise and diet but has unfortunately gained about 8 pounds since last office visit.  She also has prediabetes with A1c of 5.8 and hyperlipidemia with last LDL of 112. She would likely benefit from [facility] for weight loss, will request prior authorization to be initiated.  Will prescribe pending insurance response.

## 2023-12-29 NOTE — Telephone Encounter (Signed)
 Please initiate PA for a new start on Wegovy for weight loss. She has BMI of 37.16 with comorbid prediabetes with A1C from 12/2023 of 5.8 and hyperlipidemia with LDL of 112. She has been exercising with walking 4x a week and walks for 60 minutes at a time. She has been following a whole food diet and has been trying to manage portion sizes. Has gained 5 lbs despite these lifestyle changes.

## 2023-12-29 NOTE — Progress Notes (Signed)
 Complete physical exam  Patient: Carol Dixon   DOB: 1976-07-08   48 y.o. Female  MRN: [id number]  Subjective:    Chief Complaint  Patient presents with   Annual Exam    Carol Dixon is a 48 y.o. female who presents today for a complete physical exam. She reports consuming a  whole food  diet.  Exercise: *  She generally feels well. She reports sleeping fairly well. She does have additional problems to discuss today.   Obesity: Patient arrives today to discuss obesity.  She has been on Wegovy in the past but has had issues with insurance coverage recently.  I had completed peer to peer and also completed appeal without approval for medication.  However during peer to peer it was recommended that after patient have repeat labs if comorbidities are identified to consider attempting prior Auth for new start of Wegovy.  Patient's current BMI is 37.16 and weight is 184 pounds.  She does have comorbidities of prediabetes and hyperlipidemia.  Last A1c collected about 1 week ago was 5.8, last LDL collected at that same time was fasting and it was 112.  She has been walking 4 times a week 60 minutes at a time, she also participates in strength training 3 times a week.  She has not been calorie counting but does participate in meal prep and mainly eats a [employer] diet with focus on salads, fruits, nuts, vegetables.  She also pays attention to portion sizes.  Despite this she is actually gained 8 pounds since last visit.  Upper extremity swelling: She also reports intermittent swelling of her upper extremities.  Can occur in right or left arm but seems to occur in the left arm more frequently.  Only trigger or pattern identified is when she has been up and walking/when arm is been in a dependent position for a little while.  She will also have associated numbness and tingling.  Once the swelling resolves after she pours cool water on the extremity or rest and elevates it the numbness and tingling also  resolved.  Most recent fall risk assessment:    12/29/2023    4:05 PM  Fall Risk   Falls in the past year? 0  Number falls in past yr: 0  Injury with Fall? 0  Risk for fall due to : No Fall Risks  Follow up Falls evaluation completed     Most recent depression screenings:    12/29/2023    4:05 PM 11/17/2023    4:11 PM  PHQ 2/9 Scores  PHQ - 2 Score 0 0    Vision:Within last year and Dental: No current dental problems and Receives regular dental care  Patient Active Problem List   Diagnosis Date Noted   Encounter for general adult medical examination with abnormal findings 12/29/2023   Swelling 12/29/2023   Mass of lower outer quadrant of left breast 02/10/2023   Encounter for screening for malignant neoplasm of breast 02/10/2023   Amenorrhea 02/10/2023   Obesity 02/10/2023   Need for vaccination 06/24/2022   Colon cancer screening 06/24/2022   Hypertensive disorder 01/17/2022   Hyperlipidemia 12/10/2021   Intermittent chest pain 12/10/2021   Allergic contact dermatitis due to plants, except food 03/24/2021   Tobacco use 08/14/2018   Low HDL (under 40) 10/12/2017   Prediabetes 10/12/2017   Abnormal first trimester screen 09/30/2011   Elderly primigravida, antepartum 09/30/2011   Past Medical History:  Diagnosis Date   Breast hypertrophy 09/2014  Dental crown present 09/2014   GERD (gastroesophageal reflux disease)    History of gastric ulcer    Hypertension    Past Surgical History:  Procedure Laterality Date   BREAST REDUCTION SURGERY Bilateral 10/14/2014   Procedure: BILATERAL MAMMARY REDUCTION  (BREAST);  Surgeon: Karie Fetch, MD;  Location: [facility];  Service: Plastics;  Laterality: Bilateral;   CESAREAN SECTION  02/20/2012   Procedure: CESAREAN SECTION;  Surgeon: Jeani Hawking, MD;  Location: [facility];  Service: Gynecology;  Laterality: N/A;   TONSILLECTOMY  10/17/1994   UPPER GASTROINTESTINAL ENDOSCOPY     Social History    Socioeconomic History   Marital status: Married    Spouse name: Not on file   Number of children: Not on file   Years of education: Not on file   Highest education level: Master's degree (e.g., MA, MS, MEng, MEd, MSW, MBA)  Occupational History   Not on file  Tobacco Use   Smoking status: Every Day    Current packs/day: 0.50    Average packs/day: 0.5 packs/day for 20.0 years (10.0 ttl pk-yrs)    Types: Cigarettes   Smokeless tobacco: Never  Substance and Sexual Activity   Alcohol use: No   Drug use: No   Sexual activity: Yes  Other Topics Concern   Not on file  Social History Narrative   Not on file   Social Drivers of Health   Financial Resource Strain: Low Risk  (12/27/2023)   Overall Financial Resource Strain (CARDIA)    Difficulty of Paying Living Expenses: Not hard at all  Food Insecurity: No Food Insecurity (12/27/2023)   Hunger Vital Sign    Worried About Running Out of Food in the Last Year: Never true    Ran Out of Food in the Last Year: Never true  Transportation Needs: No Transportation Needs (12/27/2023)   PRAPARE - Administrator, Civil Service (Medical): No    Lack of Transportation (Non-Medical): No  Physical Activity: Sufficiently Active (12/27/2023)   Exercise Vital Sign    Days of Exercise per Week: 4 days    Minutes of Exercise per Session: 60 min  Stress: Stress Concern Present (12/27/2023)   [employer] of Occupational Health - Occupational Stress Questionnaire    Feeling of Stress : Rather much  Social Connections: Socially Integrated (12/27/2023)   Social Connection and Isolation Panel [NHANES]    Frequency of Communication with Friends and Family: More than three times a week    Frequency of Social Gatherings with Friends and Family: Once a week    Attends Religious Services: More than 4 times per year    Active Member of [employer] or Organizations: Yes    Attends Banker Meetings: More than 4 times per year    Marital  Status: Married  Catering manager Violence: Not At Risk (03/18/2021)   Received from [facility] visits prior to 12/17/2022., [facility] visits prior to 12/17/2022.   Humiliation, Afraid, Rape, and Kick questionnaire    Fear of Current or Ex-Partner: No    Emotionally Abused: No    Physically Abused: No    Sexually Abused: No   Family History  Problem Relation Age of Onset   Hypertension Mother    Hypertension Father    Hyperlipidemia Father    Stroke Maternal Aunt    Stroke Maternal Uncle    Diabetes Paternal Aunt    Hyperlipidemia Paternal Aunt  Cancer Paternal Uncle    COPD Maternal Grandmother    Cancer Paternal Grandmother    Cancer Paternal Grandfather    No Known Allergies    Patient Care Team: Elenore Paddy, NP as PCP - General (Nurse Practitioner) Jonelle Sidle, MD as PCP - Cardiology (Cardiology)   Outpatient Medications Prior to Visit  Medication Sig   [DISCONTINUED] Semaglutide-Weight Management (WEGOVY) 2.4 MG/0.75ML SOAJ Inject 2.4 mg into the skin once a week.   No facility-administered medications prior to visit.    Review of Systems  Constitutional:  Negative for chills, fever and weight loss.  HENT:  Negative for hearing loss and tinnitus.        (+) PND that will cause some cough  Eyes:  Negative for blurred vision and double vision.  Respiratory:  Negative for cough and wheezing.   Cardiovascular:  Negative for chest pain and palpitations.  Gastrointestinal:  Negative for abdominal pain and blood in stool.  Genitourinary:  Negative for dysuria and hematuria.  Skin:  Negative for itching and rash.  Neurological:  Negative for seizures and loss of consciousness.  Psychiatric/Behavioral:  Negative for suicidal ideas. The patient does not have insomnia.           Objective:     BP 118/86   Pulse (!) 102   Temp 98.4 F (36.9 C) (Temporal)   Ht 4\' 11"  (1.499 m)   Wt 184 lb (83.5 kg)   SpO2 98%    BMI 37.16 kg/m  BP Readings from Last 3 Encounters:  12/29/23 118/86  02/10/23 114/86  12/24/22 (!) 139/95   Wt Readings from Last 3 Encounters:  12/29/23 184 lb (83.5 kg)  11/17/23 176 lb (79.8 kg)  06/30/23 186 lb (84.4 kg)      Physical Exam Vitals reviewed.  Constitutional:      Appearance: Normal appearance.  HENT:     Head: Normocephalic and atraumatic.     Right Ear: Tympanic membrane, ear canal and external ear normal.     Left Ear: Tympanic membrane, ear canal and external ear normal.  Eyes:     General:        Right eye: No discharge.        Left eye: No discharge.     Extraocular Movements: Extraocular movements intact.     Conjunctiva/sclera: Conjunctivae normal.     Pupils: Pupils are equal, round, and reactive to light.  Neck:     Vascular: No carotid bruit.  Cardiovascular:     Rate and Rhythm: Normal rate and regular rhythm.     Pulses: Normal pulses.     Heart sounds: Normal heart sounds. No murmur heard. Pulmonary:     Effort: Pulmonary effort is normal.     Breath sounds: Normal breath sounds.  Chest:  Breasts:    Breasts are symmetrical.     Right: Normal.     Left: Normal.  Abdominal:     General: Abdomen is flat. Bowel sounds are normal. There is no distension.     Palpations: Abdomen is soft. There is no mass.     Tenderness: There is no abdominal tenderness.  Musculoskeletal:        General: No tenderness.     Cervical back: Neck supple. No muscular tenderness.     Right lower leg: No edema.     Left lower leg: No edema.  Lymphadenopathy:     Cervical: No cervical adenopathy.     Upper Body:  Right upper body: No supraclavicular adenopathy.     Left upper body: No supraclavicular adenopathy.  Skin:    General: Skin is warm and dry.  Neurological:     General: No focal deficit present.     Mental Status: She is alert and oriented to person, place, and time.     Sensory: Sensation is intact.     Motor: No weakness.     Gait:  Gait normal.  Psychiatric:        Mood and Affect: Mood normal.        Behavior: Behavior normal.        Judgment: Judgment normal.      No results found for any visits on 12/29/23.     Assessment & Plan:    Routine Health Maintenance and Physical Exam  Immunization History  Administered Date(s) Administered   Hepatitis A, Ped/Adol-2 Dose 07/26/2018, 04/26/2019   Influenza,inj,Quad PF,6+ Mos 07/05/2018, 07/10/2019, 06/24/2022   Influenza-Unspecified 07/17/2021   MMR 07/26/2018   Moderna SARS-COV2 Booster Vaccination 08/27/2020   Moderna Sars-Covid-2 Vaccination 01/03/2020, 02/05/2020   Rho (D) Immune Globulin 02/21/2012   Tdap 02/21/2012, 07/26/2018   Typhoid Live 07/26/2018    Health Maintenance  Topic Date Due   Pneumococcal Vaccine 40-56 Years old (1 of 2 - PCV) Never done   Hepatitis C Screening  Never done   Cervical Cancer Screening (HPV/Pap Cotest)  Never done   INFLUENZA VACCINE  05/18/2023   COVID-19 Vaccine (4 - 2024-25 season) 06/18/2023   Fecal DNA (Cologuard)  07/02/2025   DTaP/Tdap/Td (3 - Td or Tdap) 07/26/2028   HIV Screening  Completed   HPV VACCINES  Aged Out    Discussed health benefits of physical activity, and encouraged her to engage in regular exercise appropriate for her age and condition.  Problem List Items Addressed This Visit       Other   Prediabetes - Primary   Chronic, stable A1c of 5.8 I believe patient would benefit from weight loss medication especially weight loss medication that can also help with managing blood sugar such as Wegovy.  Will reattempt prior authorization.  Further recommendations will be made to the patient based on insurance response.      Obesity   Chronic Starting BMI initially was 39.59 with a starting weight of 196 pounds Current weight 184 pounds with current BMI of 37.16 Patient has been working on exercise and diet but has unfortunately gained about 8 pounds since last office visit.  She also has  prediabetes with A1c of 5.8 and hyperlipidemia with last LDL of 112. She would likely benefit from [facility] for weight loss, will request prior authorization to be initiated.  Will prescribe pending insurance response.      Encounter for general adult medical examination with abnormal findings   Healthy lifestyle discussed, screenings discussed Patient will be due for colon cancer screening again in about 2 years, due for mammogram in about 2 months (reminder sent to myself to place order for mammogram at that time).       Swelling   Chronic, intermittent Etiology unclear No swelling on exam today, strength bilateral and normal to both hands, radial pulses 2+ bilaterally, no sensory deficit noted on exam today, no discoloration of hands or fingers. Discussed with supervising physician and question whether or not this could be neurologic or vascular in nature.  Because the swelling seems to be the predominant symptom possibly more likely to be vascular, thus we will recommend referral to  vascular surgeon to see if further evaluation can identify etiology.  Will offer this referral option to patient and if she agrees we will order this.      Return in about 2 months (around 02/28/2024) for F/U with Rose Hegner.  In addition to annual physical exam also performed an office visit as detailed above.     Elenore Paddy, NP

## 2023-12-29 NOTE — Assessment & Plan Note (Signed)
 Healthy lifestyle discussed, screenings discussed Patient will be due for colon cancer screening again in about 2 years, due for mammogram in about 2 months (reminder sent to myself to place order for mammogram at that time).

## 2024-01-01 ENCOUNTER — Other Ambulatory Visit (HOSPITAL_COMMUNITY): Payer: Self-pay

## 2024-01-01 NOTE — Telephone Encounter (Signed)
 Issue address in different encounter

## 2024-01-08 ENCOUNTER — Other Ambulatory Visit (HOSPITAL_COMMUNITY): Payer: Self-pay | Admitting: Nurse Practitioner

## 2024-01-08 DIAGNOSIS — Z1231 Encounter for screening mammogram for malignant neoplasm of breast: Secondary | ICD-10-CM

## 2024-01-10 ENCOUNTER — Other Ambulatory Visit: Payer: Self-pay | Admitting: Nurse Practitioner

## 2024-01-10 DIAGNOSIS — R609 Edema, unspecified: Secondary | ICD-10-CM

## 2024-02-21 ENCOUNTER — Other Ambulatory Visit: Payer: Self-pay

## 2024-02-21 DIAGNOSIS — R609 Edema, unspecified: Secondary | ICD-10-CM

## 2024-02-26 ENCOUNTER — Ambulatory Visit (HOSPITAL_COMMUNITY)
Admission: RE | Admit: 2024-02-26 | Discharge: 2024-02-26 | Disposition: A | Discharge status: Home / Self Care | Source: Ambulatory Visit | Attending: Nurse Practitioner | Admitting: Nurse Practitioner

## 2024-02-26 DIAGNOSIS — Z1231 Encounter for screening mammogram for malignant neoplasm of breast: Secondary | ICD-10-CM | POA: Diagnosis not present

## 2024-02-28 ENCOUNTER — Ambulatory Visit: Payer: Self-pay | Admitting: Nurse Practitioner

## 2024-03-01 ENCOUNTER — Ambulatory Visit (HOSPITAL_COMMUNITY)
Admission: RE | Admit: 2024-03-01 | Discharge: 2024-03-01 | Disposition: A | Discharge status: Home / Self Care | Source: Ambulatory Visit | Attending: Vascular Surgery | Admitting: Vascular Surgery

## 2024-03-01 ENCOUNTER — Ambulatory Visit: Attending: Vascular Surgery | Admitting: Vascular Surgery

## 2024-03-01 ENCOUNTER — Encounter: Payer: Self-pay | Admitting: Vascular Surgery

## 2024-03-01 VITALS — BP 106/71 | HR 73 | Temp 98.0°F | Resp 20 | Ht 59.0 in | Wt 190.2 lb

## 2024-03-01 DIAGNOSIS — M7989 Other specified soft tissue disorders: Secondary | ICD-10-CM

## 2024-03-01 DIAGNOSIS — R609 Edema, unspecified: Secondary | ICD-10-CM | POA: Insufficient documentation

## 2024-03-01 DIAGNOSIS — G540 Brachial plexus disorders: Secondary | ICD-10-CM | POA: Diagnosis not present

## 2024-03-01 NOTE — Progress Notes (Signed)
 Patient ID: Carol Dixon, female   DOB: 1975-11-07, 48 y.o.   MRN: [id number]  Reason for Consult: New Patient (Initial Visit)   Referred by Zorita Hiss, NP  Subjective:  HPI Carol Dixon is a 48 y.o. female presenting for evaluation of arm swelling, left greater than right.  She reports is intermittent and not necessarily associated with any specific movement or time of day.  It happens a few times per month where her hand and wrist will swell.  She also has some numbness and tingling throughout the arm but it is not too bothersome.  She is not limited in her activities throughout the day although when she does have some swelling it is hard to make a fist.  She has also had a few episodes where she wakes up with her arm significantly swollen which then decreases throughout the morning.  She denies any significant neck or shoulder injuries in the past although may have hurt her shoulder a few years ago but this improved on its own.  Past Medical History:  Diagnosis Date   Breast hypertrophy 09/2014   Dental crown present 09/2014   GERD (gastroesophageal reflux disease)    History of gastric ulcer    Hypertension    Family History  Problem Relation Age of Onset   Hypertension Mother    Hypertension Father    Hyperlipidemia Father    Stroke Maternal Aunt    Stroke Maternal Uncle    Diabetes Paternal Aunt    Hyperlipidemia Paternal Aunt    Cancer Paternal Uncle    COPD Maternal Grandmother    Cancer Paternal Grandmother    Cancer Paternal Grandfather    Past Surgical History:  Procedure Laterality Date   BREAST REDUCTION SURGERY Bilateral 10/14/2014   Procedure: BILATERAL MAMMARY REDUCTION  (BREAST);  Surgeon: Doylene Genet, MD;  Location: [facility];  Service: Plastics;  Laterality: Bilateral;   CESAREAN SECTION  02/20/2012   Procedure: CESAREAN SECTION;  Surgeon: Martine Sleek, MD;  Location: [facility];  Service: Gynecology;  Laterality: N/A;    TONSILLECTOMY  10/17/1994   UPPER GASTROINTESTINAL ENDOSCOPY      Short Social History:  Social History   Tobacco Use   Smoking status: Every Day    Current packs/day: 0.50    Average packs/day: 0.5 packs/day for 20.0 years (10.0 ttl pk-yrs)    Types: Cigarettes   Smokeless tobacco: Never  Substance Use Topics   Alcohol use: No    No Known Allergies  No current outpatient medications on file.   No current facility-administered medications for this visit.    REVIEW OF SYSTEMS  All other systems reviewed and are negative  Objective:  Objective   Vitals:   03/01/24 1441  BP: 106/71  Pulse: 73  Resp: 20  Temp: 98 F (36.7 C)  TempSrc: Temporal  SpO2: 95%  Weight: 190 lb 3.2 oz (86.3 kg)  Height: 4\' 11"  (1.499 m)   Body mass index is 38.42 kg/m.  Physical Exam General: no acute distress Cardiac: hemodynamically stable Pulm: normal work of breathing Neuro: alert, no focal deficit Extremities: no edema, cyanosis or wounds.  No tenderness in the scalene triangle.  No tenderness in the pectoralis minor insertion.  No significant pain or numbness with elevated arm test Vascular:   Right: Palpable radial, negative Allen test  Left: Palpable radial, negative Allen test  Data: +----------+------------+---------+-----------+----------+-----------------  ----+  LEFT     CompressiblePhasicitySpontaneousProperties  Summary          +----------+------------+---------+-----------+----------+-----------------  ----+  IJV          Full                                                          +----------+------------+---------+-----------+----------+-----------------  ----+  Subclavian              Yes                         disturbed flow  with                                                         Doppler  waveform                                                                changes           +----------+------------+---------+-----------+----------+-----------------  ----+  Axillary     Full       No                                                 +----------+------------+---------+-----------+----------+-----------------  ----+  Brachial     Full                                                          +----------+------------+---------+-----------+----------+-----------------  ----+  Radial       Full                                                          +----------+------------+---------+-----------+----------+-----------------  ----+  Ulnar        Full                                                          +----------+------------+---------+-----------+----------+-----------------  ----+  Cephalic     Full                                                          +----------+------------+---------+-----------+----------+-----------------  ----+  Basilic      Full                                                          +----------+------------+---------+-----------+----------+-----------------  ----+     Summary:    Left:  No evidence of DVT in the left upper extremity.  Disturbed color flow observed in the subclavian vein with changes in  Doppler  waveform.  Continuous Doppler flow noted in the axillary vein may indicate proximal  obstruction.         Assessment/Plan:     Carol Dixon is a 48 y.o. female with intermittent left arm swelling.  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  I explained that she is negative for DVT and given that her symptoms are only few times per month I would not recommend further workup at this time as there is no role for a prophylactic first rib resection we would not offer a rib resection without significant constant symptoms or a DVT.    Instructed to use a compression sleeve on days that she is swelling.  Instructed to look up thoracic outlet syndrome exercises to do at home.  Explained that if she ever has worsening symptoms or increased frequency and symptoms that we could refer her to TOS specific therapy as well as obtain further imaging.   Follow-up as needed    Philipp Brawn MD [facility]

## 2024-03-19 DIAGNOSIS — M722 Plantar fascial fibromatosis: Secondary | ICD-10-CM | POA: Diagnosis not present

## 2024-03-19 DIAGNOSIS — M79671 Pain in right foot: Secondary | ICD-10-CM | POA: Diagnosis not present

## 2024-08-12 ENCOUNTER — Encounter: Payer: Self-pay | Admitting: Nurse Practitioner

## 2024-08-15 ENCOUNTER — Other Ambulatory Visit: Payer: Self-pay | Admitting: Nurse Practitioner

## 2024-08-15 DIAGNOSIS — F419 Anxiety disorder, unspecified: Secondary | ICD-10-CM

## 2024-08-15 DIAGNOSIS — R110 Nausea: Secondary | ICD-10-CM

## 2024-08-15 DIAGNOSIS — Z23 Encounter for immunization: Secondary | ICD-10-CM

## 2024-08-15 DIAGNOSIS — Z7184 Encounter for health counseling related to travel: Secondary | ICD-10-CM | POA: Insufficient documentation

## 2024-08-15 MED ORDER — ONDANSETRON HCL 4 MG PO TABS: 4.0000 mg | ORAL_TABLET | Freq: Three times a day (TID) | ORAL | 0 refills | Status: AC | PRN

## 2024-08-15 MED ORDER — VIVOTIF PO CPDR: 1.0000 | DELAYED_RELEASE_CAPSULE | ORAL | 0 refills | Status: AC

## 2024-08-15 MED ORDER — HYDROXYZINE PAMOATE 25 MG PO CAPS
25.0000 mg | ORAL_CAPSULE | Freq: Three times a day (TID) | ORAL | 0 refills | Status: AC | PRN
Start: 2024-08-15 — End: ?

## 2024-08-15 MED ORDER — ATOVAQUONE-PROGUANIL HCL 250-100 MG PO TABS: 1.0000 | ORAL_TABLET | Freq: Every day | ORAL | 0 refills | Status: AC

## 2024-08-15 NOTE — Addendum Note (Signed)
 Addended by: ELNOR DOMINO E on: 08/15/2024 02:06 PM   Modules accepted: Orders

## 2024-08-15 NOTE — Addendum Note (Signed)
 Addended by: ELNOR DOMINO E on: 08/15/2024 10:20 AM   Modules accepted: Orders
